# Patient Record
Sex: Female | Born: 1955 | Race: White | Hispanic: No | Smoking: Former smoker
Health system: Southern US, Community
[De-identification: ages and names within clinical notes are randomized; demographics above are authoritative.]

## PROBLEM LIST (undated history)

## (undated) DIAGNOSIS — M1990 Unspecified osteoarthritis, unspecified site: Secondary | ICD-10-CM

## (undated) DIAGNOSIS — F329 Major depressive disorder, single episode, unspecified: Secondary | ICD-10-CM

## (undated) DIAGNOSIS — T7840XA Allergy, unspecified, initial encounter: Secondary | ICD-10-CM

## (undated) DIAGNOSIS — F32A Depression, unspecified: Secondary | ICD-10-CM

## (undated) DIAGNOSIS — Z853 Personal history of malignant neoplasm of breast: Secondary | ICD-10-CM

## (undated) DIAGNOSIS — Z923 Personal history of irradiation: Secondary | ICD-10-CM

## (undated) DIAGNOSIS — D649 Anemia, unspecified: Secondary | ICD-10-CM

## (undated) DIAGNOSIS — H269 Unspecified cataract: Secondary | ICD-10-CM

## (undated) DIAGNOSIS — C50919 Malignant neoplasm of unspecified site of unspecified female breast: Secondary | ICD-10-CM

## (undated) HISTORY — DX: Depression, unspecified: F32.A

## (undated) HISTORY — DX: Unspecified osteoarthritis, unspecified site: M19.90

## (undated) HISTORY — DX: Personal history of malignant neoplasm of breast: Z85.3

## (undated) HISTORY — DX: Unspecified cataract: H26.9

## (undated) HISTORY — DX: Major depressive disorder, single episode, unspecified: F32.9

## (undated) HISTORY — PX: COLONOSCOPY: SHX174

## (undated) HISTORY — DX: Allergy, unspecified, initial encounter: T78.40XA

## (undated) HISTORY — DX: Anemia, unspecified: D64.9

---

## 2001-02-08 ENCOUNTER — Ambulatory Visit (HOSPITAL_COMMUNITY): Admission: RE | Admit: 2001-02-08 | Discharge: 2001-02-08 | Payer: Self-pay | Admitting: General Surgery

## 2003-04-15 HISTORY — PX: OTHER SURGICAL HISTORY: SHX169

## 2003-06-06 ENCOUNTER — Ambulatory Visit (HOSPITAL_COMMUNITY): Admission: RE | Admit: 2003-06-06 | Discharge: 2003-06-06 | Payer: Self-pay | Admitting: Family Medicine

## 2004-03-25 ENCOUNTER — Other Ambulatory Visit: Admission: RE | Admit: 2004-03-25 | Discharge: 2004-03-25 | Payer: Self-pay | Admitting: Dermatology

## 2004-04-14 HISTORY — PX: COLON SURGERY: SHX602

## 2004-06-07 ENCOUNTER — Ambulatory Visit (HOSPITAL_COMMUNITY): Admission: RE | Admit: 2004-06-07 | Discharge: 2004-06-07 | Payer: Self-pay | Admitting: Family Medicine

## 2004-06-26 ENCOUNTER — Ambulatory Visit (HOSPITAL_COMMUNITY): Admission: RE | Admit: 2004-06-26 | Discharge: 2004-06-26 | Payer: Self-pay | Admitting: Family Medicine

## 2004-09-06 ENCOUNTER — Ambulatory Visit (HOSPITAL_BASED_OUTPATIENT_CLINIC_OR_DEPARTMENT_OTHER): Admission: RE | Admit: 2004-09-06 | Discharge: 2004-09-06 | Payer: Self-pay | Admitting: General Surgery

## 2004-09-06 ENCOUNTER — Ambulatory Visit (HOSPITAL_COMMUNITY): Admission: RE | Admit: 2004-09-06 | Discharge: 2004-09-06 | Payer: Self-pay | Admitting: General Surgery

## 2004-11-19 ENCOUNTER — Ambulatory Visit (HOSPITAL_COMMUNITY): Admission: RE | Admit: 2004-11-19 | Discharge: 2004-11-19 | Payer: Self-pay | Admitting: Family Medicine

## 2005-01-14 ENCOUNTER — Other Ambulatory Visit: Admission: RE | Admit: 2005-01-14 | Discharge: 2005-01-14 | Payer: Self-pay | Admitting: Obstetrics and Gynecology

## 2005-01-30 ENCOUNTER — Ambulatory Visit (HOSPITAL_COMMUNITY): Admission: RE | Admit: 2005-01-30 | Discharge: 2005-01-30 | Payer: Self-pay | Admitting: Obstetrics and Gynecology

## 2005-03-01 ENCOUNTER — Emergency Department: Payer: Self-pay | Admitting: General Practice

## 2005-03-04 ENCOUNTER — Inpatient Hospital Stay: Payer: Self-pay | Admitting: Surgery

## 2005-04-14 HISTORY — PX: ABDOMINAL HYSTERECTOMY: SHX81

## 2005-04-18 ENCOUNTER — Ambulatory Visit: Payer: Self-pay | Admitting: Unknown Physician Specialty

## 2005-11-20 ENCOUNTER — Inpatient Hospital Stay: Payer: Self-pay | Admitting: Unknown Physician Specialty

## 2008-04-14 HISTORY — PX: HEEL SPUR SURGERY: SHX665

## 2011-04-15 DIAGNOSIS — C50919 Malignant neoplasm of unspecified site of unspecified female breast: Secondary | ICD-10-CM

## 2011-04-15 HISTORY — PX: BREAST SURGERY: SHX581

## 2011-04-15 HISTORY — PX: BREAST LUMPECTOMY: SHX2

## 2011-04-15 HISTORY — DX: Malignant neoplasm of unspecified site of unspecified female breast: C50.919

## 2011-07-31 ENCOUNTER — Ambulatory Visit: Payer: Self-pay | Admitting: Internal Medicine

## 2011-08-11 ENCOUNTER — Ambulatory Visit: Payer: Self-pay | Admitting: Internal Medicine

## 2011-08-26 ENCOUNTER — Ambulatory Visit: Payer: Self-pay | Admitting: General Surgery

## 2011-09-23 ENCOUNTER — Ambulatory Visit: Payer: Self-pay | Admitting: General Surgery

## 2011-09-24 LAB — PATHOLOGY REPORT

## 2011-09-26 ENCOUNTER — Ambulatory Visit: Payer: Self-pay | Admitting: Oncology

## 2011-09-29 ENCOUNTER — Ambulatory Visit: Payer: Self-pay | Admitting: Oncology

## 2011-10-13 ENCOUNTER — Ambulatory Visit: Payer: Self-pay | Admitting: Oncology

## 2011-10-13 HISTORY — PX: BREAST EXCISIONAL BIOPSY: SUR124

## 2012-01-15 ENCOUNTER — Ambulatory Visit: Payer: Self-pay | Admitting: Oncology

## 2012-02-05 LAB — CBC CANCER CENTER
Basophil #: 0 x10 3/mm (ref 0.0–0.1)
Basophil %: 0.5 %
Eosinophil %: 3.9 %
HCT: 41.5 % (ref 35.0–47.0)
Lymphocyte %: 26.7 %
MCHC: 32.5 g/dL (ref 32.0–36.0)
MCV: 94 fL (ref 80–100)
Monocyte #: 0.6 x10 3/mm (ref 0.2–0.9)
RDW: 13.2 % (ref 11.5–14.5)

## 2012-02-12 LAB — CBC CANCER CENTER
Basophil #: 0 x10 3/mm (ref 0.0–0.1)
Basophil %: 0.5 %
Eosinophil #: 0.3 x10 3/mm (ref 0.0–0.7)
HCT: 42.1 % (ref 35.0–47.0)
HGB: 13.9 g/dL (ref 12.0–16.0)
Lymphocyte #: 1.6 x10 3/mm (ref 1.0–3.6)
MCH: 30.7 pg (ref 26.0–34.0)
MCHC: 32.9 g/dL (ref 32.0–36.0)
MCV: 93 fL (ref 80–100)
Neutrophil #: 4.5 x10 3/mm (ref 1.4–6.5)
Neutrophil %: 62 %
RBC: 4.52 10*6/uL (ref 3.80–5.20)

## 2012-02-13 ENCOUNTER — Ambulatory Visit: Payer: Self-pay | Admitting: Oncology

## 2012-02-19 LAB — CBC CANCER CENTER
Basophil %: 0.5 %
Eosinophil %: 5 %
HCT: 40.6 % (ref 35.0–47.0)
Lymphocyte %: 18.9 %
Monocyte %: 12 %
Neutrophil #: 3.9 x10 3/mm (ref 1.4–6.5)
Neutrophil %: 63.6 %
WBC: 6.2 x10 3/mm (ref 3.6–11.0)

## 2012-03-14 ENCOUNTER — Ambulatory Visit: Payer: Self-pay | Admitting: Oncology

## 2012-03-18 LAB — CBC CANCER CENTER
Basophil #: 0 x10 3/mm (ref 0.0–0.1)
Basophil %: 0.6 %
Eosinophil %: 0 %
HGB: 13.6 g/dL (ref 12.0–16.0)
Lymphocyte %: 17 %
Monocyte %: 14.9 %
Neutrophil %: 67.5 %
Platelet: 129 x10 3/mm — ABNORMAL LOW (ref 150–440)
RBC: 4.36 10*6/uL (ref 3.80–5.20)
WBC: 5.3 x10 3/mm (ref 3.6–11.0)

## 2012-04-01 LAB — CBC CANCER CENTER
Basophil #: 0 x10 3/mm (ref 0.0–0.1)
Basophil %: 0.4 %
Eosinophil #: 0.1 x10 3/mm (ref 0.0–0.7)
HGB: 13.1 g/dL (ref 12.0–16.0)
Lymphocyte %: 16.5 %
MCHC: 33.8 g/dL (ref 32.0–36.0)
Neutrophil %: 71 %
Platelet: 144 x10 3/mm — ABNORMAL LOW (ref 150–440)
RBC: 4.23 10*6/uL (ref 3.80–5.20)

## 2012-04-14 ENCOUNTER — Ambulatory Visit: Payer: Self-pay | Admitting: Oncology

## 2012-07-22 ENCOUNTER — Ambulatory Visit: Payer: Self-pay | Admitting: Oncology

## 2012-08-12 ENCOUNTER — Ambulatory Visit: Payer: Self-pay | Admitting: Oncology

## 2012-08-16 ENCOUNTER — Encounter: Payer: Self-pay | Admitting: *Deleted

## 2012-09-21 ENCOUNTER — Ambulatory Visit: Payer: Self-pay | Admitting: General Surgery

## 2013-01-25 ENCOUNTER — Ambulatory Visit: Payer: Self-pay | Admitting: Radiation Oncology

## 2013-04-23 ENCOUNTER — Ambulatory Visit: Payer: Self-pay | Admitting: Otolaryngology

## 2013-04-23 LAB — BASIC METABOLIC PANEL
Anion Gap: 8 (ref 7–16)
BUN: 17 mg/dL (ref 7–18)
CALCIUM: 9 mg/dL (ref 8.5–10.1)
CO2: 25 mmol/L (ref 21–32)
Chloride: 104 mmol/L (ref 98–107)
Creatinine: 1.03 mg/dL (ref 0.60–1.30)
GLUCOSE: 121 mg/dL — AB (ref 65–99)
OSMOLALITY: 277 (ref 275–301)
POTASSIUM: 3.7 mmol/L (ref 3.5–5.1)
SODIUM: 137 mmol/L (ref 136–145)

## 2013-04-23 LAB — CBC
HCT: 45.1 % (ref 35.0–47.0)
HGB: 15.3 g/dL (ref 12.0–16.0)
MCH: 30.3 pg (ref 26.0–34.0)
MCHC: 33.9 g/dL (ref 32.0–36.0)
MCV: 90 fL (ref 80–100)
PLATELETS: 168 10*3/uL (ref 150–440)
RBC: 5.04 10*6/uL (ref 3.80–5.20)
RDW: 13.4 % (ref 11.5–14.5)
WBC: 5.4 10*3/uL (ref 3.6–11.0)

## 2014-02-13 ENCOUNTER — Encounter: Payer: Self-pay | Admitting: *Deleted

## 2014-08-05 NOTE — Op Note (Signed)
PATIENT NAME:  Candice Nelson, Candice Nelson MR#:  [id number] DATE OF BIRTH:  1955-11-17  DATE OF PROCEDURE:  04/23/2013  DATE OF DICTATION: 04/23/2013   SURGEON: Janalee Dane, MD  PREOPERATIVE DIAGNOSIS: Airway foreign body.   POSTOPERATIVE DIAGNOSIS: Airway foreign body.   PROCEDURE: Rigid bronchoscopy with foreign body removal.   DESCRIPTION OF THE PROCEDURE: The patient was placed in the supine position on the operating room table. After light general anesthesia had been induced, keeping the patient breathing spontaneously, the patient was turned 90 degrees counterclockwise from anesthesia, and the larynx was examined with the anesthesia laryngoscope (McIntosh blade). The vocal cords were sprayed with 4% plain lidocaine. Spontaneous ventilation continued, and the small adult rigid bronchoscope was placed. The trachea was examined, and during this entire rigid bronchoscopy, ventilation was used through the ventilation port of the bronchoscope. The carina was clear. The right mainstem bronchus and secondary bronchi were clear. However, in the left posterior segmental takeoff, there was significant frothy particulate matter consistent with a dissolved pill. This was suctioned completely multiple times and then irrigated with 5 mL of saline and suctioned additionally. There were no discrete large segments of foreign body, but after this had been satisfactorily suctioned, the bronchoscope was removed. The patient was returned to anesthesia, emerged uneventfully and was taken to the recovery room in stable condition. There were no complications.   ESTIMATED BLOOD LOSS: Zero.   ____________________________ Lenna Sciara. Nadeen Landau, MD jmc:lb D: 04/23/2013 11:29:08 ET T: 04/23/2013 11:53:46 ET JOB#: [id number]  cc: Janalee Dane, MD, <Dictator> Nicholos Johns MD ELECTRONICALLY SIGNED 05/03/2013 11:41

## 2014-08-05 NOTE — Consult Note (Signed)
PATIENT NAME:  Candice Nelson, Candice Nelson MR#:  [id number] DATE OF BIRTH:  05-13-1955  DATE OF CONSULTATION:  04/23/2013  CONSULTING PHYSICIAN:  Janalee Dane, MD  REQUESTING PHYSICIAN: Dr. Lisa Roca.   HISTORY OF PRESENT ILLNESS: The patient is a 59 year old white female who was taking her morning vitamins at about 7:30 this morning and began coughing and accidentally inhaled one of the medications. She came to the Emergency Room in moderate respiratory distress. She was evaluated by Dr. Reita Cliche and I was called for emergent consultation. A chest x-ray was performed that did not show any discrete foreign body and a decision was made to take the patient to the operating room for emergent rigid bronchoscopy with foreign body removal.   ALLERGIES: TYLOX, PERCODAN, PERCOCET, VALIUM, PENICILLIN.   MEDICATIONS: Reviewed and documented in the chart.   PAST MEDICAL HISTORY: Likewise reviewed and documented in the chart. Notably the patient did have right axillary nodal dissection for breast cancer as well as right breast surgery.   PHYSICAL EXAMINATION: GENERAL: The patient that is in no acute airway distress but she is coughing fairly paroxysmally.   ORAL CAVITY AND OROPHARYNX: Clear. No masses or lesions.  EARS: External auditory canals are clear. Tympanic membranes are clear.  NOSE: Nares are patent. Septum deviated slightly to the right.  NECK: Trachea is midline.  LUNGS: Diffuse wheeze slightly more prominent on the left.   IMPRESSION: Probable airway foreign body.   PLAN:  We will take the patient to the operating room for rigid bronchoscopy and airway foreign body removal emergently.      ____________________________ J. Nadeen Landau, MD jmc:dp D: 04/23/2013 11:33:04 ET T: 04/23/2013 11:41:57 ET JOB#: [id number]  cc: Janalee Dane, MD, <Dictator> [place] MD ELECTRONICALLY SIGNED 05/03/2013 11:41

## 2014-08-06 NOTE — Op Note (Signed)
PATIENT NAME:  Candice Nelson, APPLEBY MR#:  [id number] DATE OF BIRTH:  May 08, 1955  DATE OF PROCEDURE:  09/23/2011  PREOPERATIVE DIAGNOSIS: Right breast cancer.   POSTOPERATIVE DIAGNOSIS: Right breast cancer.   OPERATIVE PROCEDURES: Right breast wide excision with mastoplasty, sentinel node biopsy.   SURGEON: Robert Bellow, MD  ANESTHESIA: General by LMA under Dr. Kayleen Memos, Marcaine 0.5% plain, 30 mL local infiltration.   CLINICAL NOTE: This 59 year old woman was recently diagnosed with a right breast cancer in the 6:00 position. She desired breast conservation. She is brought to the Operating Room having previously been injected with technetium sulfur colloid.   OPERATIVE NOTE: With the patient under adequate general anesthesia, a small roll behind the right shoulder, the breast, chest, and axilla was prepped with ChloraPrep and draped. Prior to prepping 2 mL of methylene blue diluted 1:2 with normal saline was injected at the superolateral aspect of the areola. Counts obtained in the axilla with the gamma finder were low at approximately 30. After prepping and draping attention was turned to the axilla. The gamma finder was used to identify the area of maximum uptake and a small a transverse incision made at this location. This was carried down through the skin and subcutaneous tissue with hemostasis achieved by electrocautery and 3-0 Vicryl ties. A single hot blue node measuring about 8-9 mm in diameter was identified. Touch preps were reported as negative for metastatic disease by Quay Burow, M.D. The wound was closed in layers with 2-0 Vicryl figure-of-eight sutures in the deep adipose tissue and a running 4-0 Vicryl subcuticular suture for the skin. Benzoin, Steri-Strips, Telfa, and Tegaderm dressing was applied.   Attention was turned to the breast. Ultrasound was used to confirm the area of the previous biopsy. There had been a suggestion of a small duct extending from this area towards the  nipple. For this reason it was elected to make an elliptical incision at the 6:00 position including a portion of the areola extending to the base of the nipple. The borders were guided by the ultrasound. The skin was incised sharply and hemostasis achieved by electrocautery. The tissue just and under the nipple was excised and then a block of tissue extending down to but not including the deep adipose tissue. There was about 5 mm residual breast parenchyma at the bottom of the wide excision site. The specimen was orientated and report from the pathologist suggested negative margins. The breast was elevated off the underlying pectoralis fascia for a distance of approximately 6 cm. This was then approximated with interrupted 2-0 Vicryl figure-of-eight sutures. The adipose tissue was approximated in a similar fashion with an effort made to maintain the volume behind the areola. The skin on the lateral aspect of the incision was freed for a distance of about 4 cm to provide for a more smooth contour. The skin was approximated with a running 4-0 Vicryl subcuticular suture except in the area of the areola where interrupted subcuticular sutures were utilized. With the sponge, tape and instrument count correct, final dressing was applied with benzoin, Steri-Strips and Telfa. A compressive wrap with fluffed gauze, Kerlix and an Ace wrap was then applied.   Patient tolerated procedure well and was taken to recovery room in stable condition.   ____________________________ Robert Bellow, MD jwb:cms D: 09/23/2011 14:05:40 ET T: 09/23/2011 14:19:43 ET JOB#: [id number]  cc: Robert Bellow, MD, <Dictator> Ocie Cornfield. Ouida Sills, MD JEFFREY Amedeo Kinsman MD ELECTRONICALLY SIGNED 09/23/2011 22:19

## 2015-09-24 ENCOUNTER — Other Ambulatory Visit: Payer: Self-pay | Admitting: Internal Medicine

## 2015-09-24 DIAGNOSIS — Z1231 Encounter for screening mammogram for malignant neoplasm of breast: Secondary | ICD-10-CM

## 2015-09-25 ENCOUNTER — Other Ambulatory Visit: Payer: Self-pay | Admitting: Internal Medicine

## 2015-09-25 DIAGNOSIS — R928 Other abnormal and inconclusive findings on diagnostic imaging of breast: Secondary | ICD-10-CM

## 2015-10-11 ENCOUNTER — Ambulatory Visit
Admission: RE | Admit: 2015-10-11 | Discharge: 2015-10-11 | Disposition: A | Discharge status: Home / Self Care | Payer: Managed Care, Other (non HMO) | Source: Ambulatory Visit | Attending: Internal Medicine | Admitting: Internal Medicine

## 2015-10-11 ENCOUNTER — Ambulatory Visit: Payer: Managed Care, Other (non HMO)

## 2015-10-11 DIAGNOSIS — R928 Other abnormal and inconclusive findings on diagnostic imaging of breast: Secondary | ICD-10-CM

## 2016-11-14 ENCOUNTER — Other Ambulatory Visit: Payer: Self-pay | Admitting: Internal Medicine

## 2016-11-14 DIAGNOSIS — Z9889 Other specified postprocedural states: Secondary | ICD-10-CM

## 2016-11-14 DIAGNOSIS — R928 Other abnormal and inconclusive findings on diagnostic imaging of breast: Secondary | ICD-10-CM

## 2016-12-17 ENCOUNTER — Ambulatory Visit
Admission: RE | Admit: 2016-12-17 | Discharge: 2016-12-17 | Disposition: A | Discharge status: Home / Self Care | Payer: Managed Care, Other (non HMO) | Source: Ambulatory Visit | Attending: Internal Medicine | Admitting: Internal Medicine

## 2016-12-17 DIAGNOSIS — Z9889 Other specified postprocedural states: Secondary | ICD-10-CM

## 2016-12-17 DIAGNOSIS — R928 Other abnormal and inconclusive findings on diagnostic imaging of breast: Secondary | ICD-10-CM

## 2016-12-17 HISTORY — DX: Personal history of irradiation: Z92.3

## 2017-09-09 ENCOUNTER — Other Ambulatory Visit: Payer: Self-pay | Admitting: Internal Medicine

## 2017-10-23 ENCOUNTER — Telehealth: Payer: Self-pay | Admitting: Podiatry

## 2017-10-23 NOTE — Telephone Encounter (Signed)
I had surgery with Dr. Milinda Pointer on my ankle in January 2010. I need my records for that surgery to be faxed over to Dr. Roland Rack. Their fax number is [phone].

## 2017-10-23 NOTE — Telephone Encounter (Signed)
Called and left a voicemail letting pt know I received her voicemail and fax but I need her to sign a medical records release form. I told her I could either fax it to her or e-mail it to her or she could come by the office and fill the form out and sign it in the [place] office. I told her she could call me back directly at [phone] and to leave a message if I do not answer.

## 2017-12-23 ENCOUNTER — Ambulatory Visit: Payer: Managed Care, Other (non HMO)

## 2017-12-23 ENCOUNTER — Ambulatory Visit
Admission: RE | Admit: 2017-12-23 | Discharge: 2017-12-23 | Disposition: A | Discharge status: Home / Self Care | Payer: Managed Care, Other (non HMO) | Source: Ambulatory Visit | Attending: Oncology | Admitting: Oncology

## 2017-12-23 VITALS — BP 161/75 | HR 62 | Temp 98.1°F | Ht 65.0 in | Wt 134.0 lb

## 2017-12-23 DIAGNOSIS — Z853 Personal history of malignant neoplasm of breast: Secondary | ICD-10-CM | POA: Insufficient documentation

## 2017-12-23 DIAGNOSIS — Z0000 Encounter for general adult medical examination without abnormal findings: Secondary | ICD-10-CM

## 2017-12-23 HISTORY — DX: Malignant neoplasm of unspecified site of unspecified female breast: C50.919

## 2017-12-23 NOTE — Progress Notes (Signed)
  Subjective:     Patient ID: Candice Nelson, female   DOB: 02/03/1956, 62 y.o.   MRN: [id number]  HPI   Review of Systems     Objective:   Physical Exam  Pulmonary/Chest: Right breast exhibits no inverted nipple, no mass, no nipple discharge, no skin change and no tenderness. Left breast exhibits no inverted nipple, no mass, no nipple discharge, no skin change and no tenderness. Breasts are symmetrical.  History of right lumpectomy with sentinel node biopsy for breast cancer 2013         Assessment:     62 year old with history of right breast cancer presents for [facility] clinic visit.   Instructed patient on breast self awareness using teach back method.  Clinical breast exam unremarkable. No mass or lump palpated.    Plan:     Per Lorriane Shire in [facility], patient sent for bilateral screening mammogram.

## 2017-12-25 NOTE — Progress Notes (Signed)
Letter mailed from [facility] to notify of normal mammogram results.  Patient to return in one year for annual screening.  Copy to [facility]. 

## 2018-09-09 ENCOUNTER — Encounter: Payer: Self-pay | Admitting: *Deleted

## 2018-11-18 ENCOUNTER — Ambulatory Visit (INDEPENDENT_AMBULATORY_CARE_PROVIDER_SITE_OTHER): Payer: Self-pay | Admitting: *Deleted

## 2018-11-18 ENCOUNTER — Other Ambulatory Visit: Payer: Self-pay

## 2018-11-18 DIAGNOSIS — Z1211 Encounter for screening for malignant neoplasm of colon: Secondary | ICD-10-CM

## 2018-11-18 MED ORDER — NA SULFATE-K SULFATE-MG SULF 17.5-3.13-1.6 GM/177ML PO SOLN: 1.0000 | Freq: Once | ORAL | 0 refills | Status: AC

## 2018-11-18 NOTE — Progress Notes (Signed)
Gastroenterology Pre-Procedure Review  Request Date: 11/18/2018 Requesting Physician: Delman Cheadle, PA @ [facility], Last TCS 17 years ago done at [facility] per pt,could not remember physician  PATIENT REVIEW QUESTIONS: The patient responded to the following health history questions as indicated:    1. Diabetes Melitis: No 2. Joint replacements in the past 12 months: No 3. Major health problems in the past 3 months: No 4. Has an artificial valve or MVP: No 5. Has a defibrillator: No 6. Has been advised in past to take antibiotics in advance of a procedure like teeth cleaning: No 7. Family history of colon cancer: No 8. Alcohol Use: No 9. History of sleep apnea: No 10. History of coronary artery or other vascular stents placed within the last 12 months: No 11. History of any prior anesthesia complications: Yes, makes her very sick, Woke up during procedure vomiting at age 63.    MEDICATIONS & ALLERGIES:    Patient reports the following regarding taking any blood thinners:   Plavix? No Aspirin? Yes, 2000 mg daily Coumadin? No Brilinta? No Xarelto? No Eliquis? No Pradaxa? No Savaysa? No Effient? No  Patient confirms/reports the following medications:  Current Outpatient Medications  Medication Sig Dispense Refill  . aspirin 500 MG EC tablet Take 500 mg by mouth every 6 (six) hours as needed for pain. Takes 4 tablets/ 2000 mg daily    . buPROPion (WELLBUTRIN XL) 300 MG 24 hr tablet Take 300 mg by mouth daily.     No current facility-administered medications for this visit.     Patient confirms/reports the following allergies:  Allergies  Allergen Reactions  . Demerol [Meperidine] Nausea Only  . Morphine And Related Hives  . Percocet [Oxycodone-Acetaminophen] Nausea Only  . Shellfish Allergy Nausea Only  . Valium [Diazepam] Nausea Only  . Penicillins Rash  . Sulfa Antibiotics Rash    No orders of the defined types were placed in this encounter.   AUTHORIZATION  INFORMATION Primary Insurance: [place] Falling,  [place] #: [id number],  Group #: [id number] Pre-Cert / Josem Kaufmann required: Not required  SCHEDULE INFORMATION: Procedure has been scheduled as follows:  Date: 12/13/2018, Time: 12:00  Location: [facility] with Dr. Oneida Alar  This Gastroenterology Pre-Precedure Review Form is being routed to the following provider(s): Neil Crouch, PA-C

## 2018-11-18 NOTE — Patient Instructions (Signed)
Candice Nelson  1955-10-10 MRN: [id number]     Procedure Date: 12/13/2018 Time to register: 11:00 am Place to register: [facility] Procedure Time: 12:00 pm Scheduled provider: Dr. Oneida Alar    PREPARATION FOR COLONOSCOPY WITH SUPREP BOWEL PREP KIT  Note: Suprep Bowel Prep Kit is a split-dose (2day) regimen. Consumption of BOTH 6-ounce bottles is required for a complete prep.  Please notify us immediately if you are diabetic, take iron supplements, or if you are on Coumadin or any other blood thinners.                                                                                                                                                   1 DAY BEFORE PROCEDURE:  DATE: 12/12/2018 DAY: Sunday Continue clear liquids the entire day - NO SOLID FOOD.     At 6:00pm: Complete steps 1 through 4 below, using ONE (1) 6-ounce bottle, before going to bed. Step 1:  Pour ONE (1) 6-ounce bottle of SUPREP liquid into the mixing container.  Step 2:  Add cool drinking water to the 16 ounce line on the container and mix.  Note: Dilute the solution concentrate as directed prior to use. Step 3:  DRINK ALL the liquid in the container. Step 4:  You MUST drink an additional two (2) or more 16 ounce containers of water over the next one (1) hour.   Continue clear liquids.  DAY OF PROCEDURE:   DATE: 12/13/2018  DAY: Monday If you take medications for your heart, blood pressure, or breathing, you may take these medications.    5 hours before your procedure at :  7:00 am Step 1:  Pour ONE (1) 6-ounce bottle of SUPREP liquid into the mixing container.  Step 2:  Add cool drinking water to the 16 ounce line on the container and mix.  Note: Dilute the solution concentrate as directed prior to use. Step 3:  DRINK ALL the liquid in the container. Step 4:  You MUST drink an additional two (2) or more 16 ounce containers of water over the next one (1) hour. You MUST complete the final glass of  water at least 3 hours before your colonoscopy. Nothing by mouth past 9:00 am  You may take your morning medications with sip of water unless we have instructed otherwise.    Please see below for Dietary Information.  CLEAR LIQUIDS INCLUDE:  Water Jello (NOT red in color)   Ice Popsicles (NOT red in color)   Tea (sugar ok, no milk/cream) Powdered fruit flavored drinks  Coffee (sugar ok, no milk/cream) Gatorade/ Lemonade/ Kool-Aid  (NOT red in color)   Juice: apple, white grape, white cranberry Soft drinks  Clear bullion, consomme, broth (fat free beef/chicken/vegetable)  Carbonated beverages (any kind)  Strained chicken noodle soup Hard Candy   Remember: Clear  liquids are liquids that will allow you to see your fingers on the other side of a clear glass. Be sure liquids are NOT red in color, and not cloudy, but CLEAR.  DO NOT EAT OR DRINK ANY OF THE FOLLOWING:  Dairy products of any kind   Cranberry juice Tomato juice / V8 juice   Grapefruit juice Orange juice     Red grape juice  Do not eat any solid foods, including such foods as: cereal, oatmeal, yogurt, fruits, vegetables, creamed soups, eggs, bread, crackers, pureed foods in a blender, etc.   HELPFUL HINTS FOR DRINKING PREP SOLUTION:   Make sure prep is extremely cold. Mix and refrigerate the the morning of the prep. You may also put in the freezer.   You may try mixing some Crystal Light or Country Time Lemonade if you prefer. Mix in small amounts; add more if necessary.  Try drinking through a straw  Rinse mouth with water or a mouthwash between glasses, to remove after-taste.  Try sipping on a cold beverage /ice/ popsicles between glasses of prep.  Place a piece of sugar-free hard candy in mouth between glasses.  If you become nauseated, try consuming smaller amounts, or stretch out the time between glasses. Stop for 30-60 minutes, then slowly start back drinking.     OTHER INSTRUCTIONS  You will need a  responsible adult at least 62 years of age to accompany you and drive you home. This person must remain in the waiting room during your procedure. The hospital will cancel your procedure if you do not have a responsible adult with you.   1. Wear loose fitting clothing that is easily removed. 2. Leave jewelry and other valuables at home.  3. Remove all body piercing jewelry and leave at home. 4. Total time from sign-in until discharge is approximately 2-3 hours. 5. You should go home directly after your procedure and rest. You can resume normal activities the day after your procedure. 6. The day of your procedure you should not:  Drive  Make legal decisions  Operate machinery  Drink alcohol  Return to work   You may call the office (Dept: [phone]) before 5:00pm, or page the doctor on call [phone]) after 5:00pm, for further instructions, if necessary.   Insurance Information YOU WILL NEED TO CHECK WITH YOUR INSURANCE COMPANY FOR THE BENEFITS OF COVERAGE YOU HAVE FOR THIS PROCEDURE.  UNFORTUNATELY, NOT ALL INSURANCE COMPANIES HAVE BENEFITS TO COVER ALL OR PART OF THESE TYPES OF PROCEDURES.  IT IS YOUR RESPONSIBILITY TO CHECK YOUR BENEFITS, HOWEVER, WE WILL BE GLAD TO ASSIST YOU WITH ANY CODES YOUR INSURANCE COMPANY MAY NEED.    PLEASE NOTE THAT MOST INSURANCE COMPANIES WILL NOT COVER A SCREENING COLONOSCOPY FOR PEOPLE UNDER THE AGE OF 50  IF YOU HAVE BCBS INSURANCE, YOU MAY HAVE BENEFITS FOR A SCREENING COLONOSCOPY BUT IF POLYPS ARE FOUND THE DIAGNOSIS WILL CHANGE AND THEN YOU MAY HAVE A DEDUCTIBLE THAT WILL NEED TO BE MET. SO PLEASE MAKE SURE YOU CHECK YOUR BENEFITS FOR A SCREENING COLONOSCOPY AS WELL AS A DIAGNOSTIC COLONOSCOPY.

## 2018-11-19 NOTE — Progress Notes (Signed)
Ok to schedule.   Please tell patient that she should take no more than ASA 325mg  per day the week prior to her colonoscopy. May be limited after colonoscopy based on if polyps taken out due to risk of bleeding.   Please give Phenergan 12.5mg  IV 30 minutes before procedure to help prevent n/v related to anesthesia.

## 2018-11-22 ENCOUNTER — Encounter: Payer: Self-pay | Admitting: *Deleted

## 2018-11-22 NOTE — Progress Notes (Signed)
Called pt and informed her of recommendations concerning ASA.  Pt aware that I will also send out a reminder letter of these recommendations.  Pt also informed that she will receive Phenergan prior to her procedure to help with n/v.

## 2018-11-22 NOTE — Addendum Note (Signed)
Addended by: Metro Kung on: 11/22/2018 10:40 AM   Modules accepted: Orders, SmartSet

## 2018-12-10 ENCOUNTER — Other Ambulatory Visit (HOSPITAL_COMMUNITY)
Admission: RE | Admit: 2018-12-10 | Discharge: 2018-12-10 | Disposition: A | Discharge status: Home / Self Care | Payer: 59 | Source: Ambulatory Visit | Attending: Gastroenterology | Admitting: Gastroenterology

## 2018-12-10 ENCOUNTER — Other Ambulatory Visit: Payer: Self-pay

## 2018-12-10 DIAGNOSIS — Z20828 Contact with and (suspected) exposure to other viral communicable diseases: Secondary | ICD-10-CM | POA: Insufficient documentation

## 2018-12-10 DIAGNOSIS — Z01812 Encounter for preprocedural laboratory examination: Secondary | ICD-10-CM | POA: Insufficient documentation

## 2018-12-10 LAB — SARS CORONAVIRUS 2 (TAT 6-24 HRS): SARS Coronavirus 2: NEGATIVE

## 2018-12-13 ENCOUNTER — Ambulatory Visit (HOSPITAL_COMMUNITY)
Admission: RE | Admit: 2018-12-13 | Discharge: 2018-12-13 | Disposition: A | Discharge status: Home / Self Care | Payer: No Typology Code available for payment source | Attending: Gastroenterology | Admitting: Gastroenterology

## 2018-12-13 ENCOUNTER — Encounter (HOSPITAL_COMMUNITY)
Admission: RE | Disposition: A | Discharge status: Home / Self Care | Payer: Self-pay | Source: Home / Self Care | Attending: Gastroenterology

## 2018-12-13 ENCOUNTER — Encounter (HOSPITAL_COMMUNITY): Payer: Self-pay | Admitting: *Deleted

## 2018-12-13 DIAGNOSIS — K621 Rectal polyp: Secondary | ICD-10-CM | POA: Diagnosis not present

## 2018-12-13 DIAGNOSIS — D122 Benign neoplasm of ascending colon: Secondary | ICD-10-CM | POA: Insufficient documentation

## 2018-12-13 DIAGNOSIS — Z853 Personal history of malignant neoplasm of breast: Secondary | ICD-10-CM | POA: Diagnosis not present

## 2018-12-13 DIAGNOSIS — Q438 Other specified congenital malformations of intestine: Secondary | ICD-10-CM | POA: Insufficient documentation

## 2018-12-13 DIAGNOSIS — K5730 Diverticulosis of large intestine without perforation or abscess without bleeding: Secondary | ICD-10-CM | POA: Insufficient documentation

## 2018-12-13 DIAGNOSIS — F329 Major depressive disorder, single episode, unspecified: Secondary | ICD-10-CM | POA: Diagnosis not present

## 2018-12-13 DIAGNOSIS — K635 Polyp of colon: Secondary | ICD-10-CM

## 2018-12-13 DIAGNOSIS — D123 Benign neoplasm of transverse colon: Secondary | ICD-10-CM | POA: Diagnosis not present

## 2018-12-13 DIAGNOSIS — Z1211 Encounter for screening for malignant neoplasm of colon: Secondary | ICD-10-CM | POA: Diagnosis not present

## 2018-12-13 DIAGNOSIS — Z923 Personal history of irradiation: Secondary | ICD-10-CM | POA: Diagnosis not present

## 2018-12-13 DIAGNOSIS — Z87891 Personal history of nicotine dependence: Secondary | ICD-10-CM | POA: Diagnosis not present

## 2018-12-13 DIAGNOSIS — Z79899 Other long term (current) drug therapy: Secondary | ICD-10-CM | POA: Diagnosis not present

## 2018-12-13 DIAGNOSIS — K648 Other hemorrhoids: Secondary | ICD-10-CM | POA: Diagnosis not present

## 2018-12-13 DIAGNOSIS — K644 Residual hemorrhoidal skin tags: Secondary | ICD-10-CM | POA: Diagnosis not present

## 2018-12-13 HISTORY — PX: POLYPECTOMY: SHX5525

## 2018-12-13 HISTORY — PX: COLONOSCOPY: SHX5424

## 2018-12-13 SURGERY — COLONOSCOPY
Anesthesia: Moderate Sedation

## 2018-12-13 MED ORDER — SODIUM CHLORIDE 0.9% FLUSH
INTRAVENOUS | Status: AC
  Filled 2018-12-13: qty 10

## 2018-12-13 MED ORDER — FENTANYL CITRATE (PF) 100 MCG/2ML IJ SOLN
INTRAMUSCULAR | Status: AC
  Filled 2018-12-13: qty 2

## 2018-12-13 MED ORDER — MIDAZOLAM HCL 5 MG/5ML IJ SOLN
INTRAMUSCULAR | Status: AC
  Filled 2018-12-13: qty 10

## 2018-12-13 MED ORDER — FENTANYL CITRATE (PF) 100 MCG/2ML IJ SOLN
INTRAMUSCULAR | Status: DC | PRN
  Administered 2018-12-13: 25 ug via INTRAVENOUS
  Administered 2018-12-13: 50 ug via INTRAVENOUS

## 2018-12-13 MED ORDER — MIDAZOLAM HCL 5 MG/5ML IJ SOLN
INTRAMUSCULAR | Status: DC | PRN
  Administered 2018-12-13: 2 mg via INTRAVENOUS
  Administered 2018-12-13: 1 mg via INTRAVENOUS
  Administered 2018-12-13: 2 mg via INTRAVENOUS

## 2018-12-13 MED ORDER — PROMETHAZINE HCL 25 MG/ML IJ SOLN
INTRAMUSCULAR | Status: DC | PRN
  Administered 2018-12-13: 12.5 mg via INTRAVENOUS

## 2018-12-13 MED ORDER — PROMETHAZINE HCL 25 MG/ML IJ SOLN
12.5000 mg | Freq: Once | INTRAMUSCULAR | Status: AC
  Administered 2018-12-13: 12.5 mg via INTRAVENOUS

## 2018-12-13 MED ORDER — SODIUM CHLORIDE 0.9 % IV SOLN
INTRAVENOUS | Status: DC
  Administered 2018-12-13: 12:00:00 via INTRAVENOUS

## 2018-12-13 MED ORDER — PROMETHAZINE HCL 25 MG/ML IJ SOLN
INTRAMUSCULAR | Status: AC
  Filled 2018-12-13: qty 1

## 2018-12-13 NOTE — Discharge Instructions (Signed)
You have small internal hemorrhoids and diverticulosis IN YOUR LEFT COLON. YOU HAD SIX POLYPS REMOVED. YOU HAVE A FLOPPY LEFT COLON. I HAD TO CHANGE TO A SCOPE THAT WAS MORE FLEXIBLE AND SMALLER THAN A PEDIATRIC SCOPE TO MAKE IT THROUGH THE RECTOSIGMOID(LEFT) COLON.    DRINK WATER TO KEEP YOUR URINE LIGHT YELLOW.  FOLLOW A HIGH FIBER DIET. AVOID ITEMS THAT CAUSE BLOATING. See info below.   USE PREPARATION H FOUR TIMES  A DAY IF NEEDED TO RELIEVE RECTAL PAIN/PRESSURE/BLEEDING.   YOUR BIOPSY RESULTS WILL BE BACK IN 5 BUSINESS DAYS.  Next colonoscopy in 3 years.  Colonoscopy Care After Read the instructions outlined below and refer to this sheet in the next week. These discharge instructions provide you with general information on caring for yourself after you leave the hospital. While your treatment has been planned according to the most current medical practices available, unavoidable complications occasionally occur. If you have any problems or questions after discharge, call DR. Nocholas Damaso, [phone].  ACTIVITY  You may resume your regular activity, but move at a slower pace for the next 24 hours.   Take frequent rest periods for the next 24 hours.   Walking will help get rid of the air and reduce the bloated feeling in your belly (abdomen).   No driving for 24 hours (because of the medicine (anesthesia) used during the test).   You may shower.   Do not sign any important legal documents or operate any machinery for 24 hours (because of the anesthesia used during the test).    NUTRITION  Drink plenty of fluids.   You may resume your normal diet as instructed by your doctor.   Begin with a light meal and progress to your normal diet. Heavy or fried foods are harder to digest and may make you feel sick to your stomach (nauseated).   Avoid alcoholic beverages for 24 hours or as instructed.    MEDICATIONS  You may resume your normal medications.   WHAT YOU CAN EXPECT  TODAY  Some feelings of bloating in the abdomen.   Passage of more gas than usual.   Spotting of blood in your stool or on the toilet paper  .  IF YOU HAD POLYPS REMOVED DURING THE COLONOSCOPY:  Eat a soft diet IF YOU HAVE NAUSEA, BLOATING, ABDOMINAL PAIN, OR VOMITING.    FINDING OUT THE RESULTS OF YOUR TEST Not all test results are available during your visit. DR. Oneida Alar WILL CALL YOU WITHIN 14 DAYS OF YOUR PROCEDUE WITH YOUR RESULTS. Do not assume everything is normal if you have not heard from DR. Cornelius Schuitema, CALL HER OFFICE AT [phone].  SEEK IMMEDIATE MEDICAL ATTENTION AND CALL THE OFFICE: [phone] IF:  You have more than a spotting of blood in your stool.   Your belly is swollen (abdominal distention).   You are nauseated or vomiting.   You have a temperature over 101F.   You have abdominal pain or discomfort that is severe or gets worse throughout the day.  High-Fiber Diet A high-fiber diet changes your normal diet to include more whole grains, legumes, fruits, and vegetables. Changes in the diet involve replacing refined carbohydrates with unrefined foods. The calorie level of the diet is essentially unchanged. The Dietary Reference Intake (recommended amount) for adult males is 38 grams per day. For adult females, it is 25 grams per day. Pregnant and lactating women should consume 28 grams of fiber per day. Fiber is the intact part of a plant  that is not broken down during digestion. Functional fiber is fiber that has been isolated from the plant to provide a beneficial effect in the body.  PURPOSE  Increase stool bulk.   Ease and regulate bowel movements.   Lower cholesterol.   REDUCE RISK OF COLON CANCER  INDICATIONS THAT YOU NEED MORE FIBER  Constipation and hemorrhoids.   Uncomplicated diverticulosis (intestine condition) and irritable bowel syndrome.   Weight management.   As a protective measure against hardening of the arteries (atherosclerosis),  diabetes, and cancer.   GUIDELINES FOR INCREASING FIBER IN THE DIET  Start adding fiber to the diet slowly. A gradual increase of about 5 more grams (2 servings of most fruits or vegetables) per day is best. Too rapid an increase in fiber may result in constipation, flatulence, and bloating.   Drink enough water and fluids to keep your urine clear or pale yellow. Water, juice, or caffeine-free drinks are recommended. Not drinking enough fluid may cause constipation.   Eat a variety of high-fiber foods rather than one type of fiber.   Try to increase your intake of fiber through using high-fiber foods rather than fiber pills or supplements that contain small amounts of fiber.   The goal is to change the types of food eaten. Do not supplement your present diet with high-fiber foods, but replace foods in your present diet.     Polyps, Colon  A polyp is extra tissue that grows inside your body. Colon polyps grow in the large intestine. The large intestine, also called the colon, is part of your digestive system. It is a long, hollow tube at the end of your digestive tract where your body makes and stores stool. Most polyps are not dangerous. They are benign. This means they are not cancerous. But over time, some types of polyps can turn into cancer. Polyps that are smaller than a pea are usually not harmful. But larger polyps could someday become or may already be cancerous. To be safe, doctors remove all polyps and test them.   PREVENTION There is not one sure way to prevent polyps. You might be able to lower your risk of getting them if you:  Eat more fruits and vegetables and less fatty food.   Do not smoke.   Avoid alcohol.   Exercise every day.   Lose weight if you are overweight.   Eating more calcium and folate can also lower your risk of getting polyps. Some foods that are rich in calcium are milk, cheese, and broccoli. Some foods that are rich in folate are chickpeas, kidney  beans, and spinach.    Diverticulosis Diverticulosis is a common condition that develops when small pouches (diverticula) form in the wall of the colon. The risk of diverticulosis increases with age. It happens more often in people who eat a low-fiber diet. Most individuals with diverticulosis have no symptoms. Those individuals with symptoms usually experience belly (abdominal) pain, constipation, or loose stools (diarrhea).  HOME CARE INSTRUCTIONS  Increase the amount of fiber in your diet as directed by your caregiver or dietician. This may reduce symptoms of diverticulosis.   Drink at least 6 to 8 glasses of water each day to prevent constipation.   Try not to strain when you have a bowel movement.   Avoiding nuts and seeds to prevent complications is NOT NECESSARY.   FOODS HAVING HIGH FIBER CONTENT INCLUDE:  Fruits. Apple, peach, pear, tangerine, raisins, prunes.   Vegetables. Brussels sprouts, asparagus, broccoli, cabbage, carrot,  cauliflower, romaine lettuce, spinach, summer squash, tomato, winter squash, zucchini.   Starchy Vegetables. Baked beans, kidney beans, lima beans, split peas, lentils, potatoes (with skin).    SEEK IMMEDIATE MEDICAL CARE IF:  You develop increasing pain or severe bloating.   You have an oral temperature above 101F.   You develop vomiting or bowel movements that are bloody or black.

## 2018-12-13 NOTE — Progress Notes (Signed)
PT is aware.

## 2018-12-13 NOTE — Op Note (Signed)
[facility] Patient Name: Candice Nelson Procedure Date: 12/13/2018 11:31 AM MRN: [id number] Date of Birth: 09-11-1955 Attending MD: Barney Drain MD, MD CSN: [id number] Age: 63 Admit Type: Outpatient Procedure:                Colonoscopy WITH COLD FORCEPS/SNARE POLYPECTOMY Indications:              Screening for colorectal malignant neoplasm Providers:                Barney Drain MD, MD, Janeece Riggers, RN, Aram Candela Referring MD:             Jake Samples PA Medicines:                Promethazine 25 mg IV, Fentanyl 75 micrograms IV,                            Midazolam 5 mg IV Complications:            No immediate complications. Estimated Blood Loss:     Estimated blood loss was minimal. Procedure:                Pre-Anesthesia Assessment:                           - Prior to the procedure, a History and Physical                            was performed, and patient medications and                            allergies were reviewed. The patient's tolerance of                            previous anesthesia was also reviewed. The risks                            and benefits of the procedure and the sedation                            options and risks were discussed with the patient.                            All questions were answered, and informed consent                            was obtained. Prior Anticoagulants: The patient has                            taken no previous anticoagulant or antiplatelet                            agents. ASA Grade Assessment: II - A patient with                            mild systemic disease. After reviewing the risks  and benefits, the patient was deemed in                            satisfactory condition to undergo the procedure.                            After obtaining informed consent, the colonoscope                            was passed under direct vision. Throughout the                             procedure, the patient's blood pressure, pulse, and                            oxygen saturations were monitored continuously. The                            [id number]) scope was introduced through                            the anus and advanced to the the cecum, identified                            by appendiceal orifice and ileocecal valve. The                            colonoscopy was technically difficult and complex                            due to restricted mobility of the colon and a                            tortuous colon. Successful completion of the                            procedure was aided by increasing the dose of                            sedation medication, changing                            endoscopes(ULTRASLIM), straightening and shortening                            the scope to obtain bowel loop reduction and                            COLOWRAP. The patient tolerated the procedure                            fairly well. The quality of the bowel preparation  was excellent. The ileocecal valve, appendiceal                            orifice, and rectum were photographed. Scope In: 12:01:52 PM Scope Out: 12:31:40 PM Scope Withdrawal Time: 0 hours 21 minutes 26 seconds  Total Procedure Duration: 0 hours 29 minutes 48 seconds  Findings:      Four sessile polyps were found in the hepatic flexure, proximal       ascending colon and cecum(2)-BTL 1. The polyps were 2 to 8 mm in size.       These polyps were removed with a cold snare. Resection and retrieval       were complete.      Two sessile polyps were found in the rectum. The polyps were 2 to 4 mm       in size. These polyps were removed with a cold biopsy forceps. Resection       and retrieval were complete.      Multiple small and large-mouthed diverticula were found in the       recto-sigmoid colon and sigmoid colon.      External and internal hemorrhoids were  found.      The recto-sigmoid colon, sigmoid colon and descending colon were grossly       tortuous. Impression:               - Four 2 to 8 mm polyps at the hepatic flexure, in                            the proximal ascending colon and in the cecum,                            removed with a cold snare. Resected and retrieved.                           - Two 2 to 4 mm polyps in the rectum, removed with                            a cold biopsy forceps. Resected and retrieved.                           - Diverticulosis in the recto-sigmoid colon and in                            the sigmoid colon.                           - External and internal hemorrhoids.                           - Tortuous ANGULATED RECTOSIGMOID/LEFT colon. Moderate Sedation:      Moderate (conscious) sedation was administered by the endoscopy nurse       and supervised by the endoscopist. The following parameters were       monitored: oxygen saturation, heart rate, blood pressure, and response       to care. Total physician intraservice time was 41 minutes. Recommendation:           -  Patient has a contact number available for                            emergencies. The signs and symptoms of potential                            delayed complications were discussed with the                            patient. Return to normal activities tomorrow.                            Written discharge instructions were provided to the                            patient.                           - High fiber diet.                           - Continue present medications.                           - Await pathology results.                           - Repeat colonoscopy in 3 years for surveillance                            WITH ULTRASLIM SCOPE AND PROPOFOL. Procedure Code(s):        --- Professional ---                           [phone], Colonoscopy, flexible; with removal of                            tumor(s), polyp(s), or  other lesion(s) by snare                            technique                           45380, 59, Colonoscopy, flexible; with biopsy,                            single or multiple                           99153, Moderate sedation; each additional 15                            minutes intraservice time                           99153, Moderate sedation; each additional 15  minutes intraservice time                           G0500, Moderate sedation services provided by the                            same physician or other qualified health care                            professional performing a gastrointestinal                            endoscopic service that sedation supports,                            requiring the presence of an independent trained                            observer to assist in the monitoring of the                            patient's level of consciousness and physiological                            status; initial 15 minutes of intra-service time;                            patient age 68 years or older (additional time may                            be reported with [phone], as appropriate) Diagnosis Code(s):        --- Professional ---                           Z12.11, Encounter for screening for malignant                            neoplasm of colon                           K63.5, Polyp of colon                           K62.1, Rectal polyp                           K64.8, Other hemorrhoids                           K57.30, Diverticulosis of large intestine without                            perforation or abscess without bleeding                           Q43.8, Other specified congenital malformations of  intestine CPT copyright 2019 American Medical Association. All rights reserved. The codes documented in this report are preliminary and upon coder review may  be revised to meet current compliance  requirements. Barney Drain, MD Barney Drain MD, MD 12/13/2018 12:58:40 PM This report has been signed electronically. Number of Addenda: 0

## 2018-12-13 NOTE — H&P (Signed)
Primary Care Physician:  Scherrie Bateman Primary Gastroenterologist:  Dr. Oneida Alar  Pre-Procedure History & Physical: HPI:  Candice Nelson is a 63 y.o. female here for [place].  Past Medical History:  Diagnosis Date  . Breast cancer ([place]) 2013   right breast  . Depression   . Personal history of malignant neoplasm of breast   . Personal history of radiation therapy     Past Surgical History:  Procedure Laterality Date  . ABDOMINAL HYSTERECTOMY  2007  . BREAST EXCISIONAL BIOPSY Right 10/2011   breast ca radation  . BREAST LUMPECTOMY Right 2013  . BREAST SURGERY Right 2013   T1c N0, M0, ER positive, PR indeterminate, no HER-2/neu amplification  . COLON SURGERY  2006   perforated bowel   . COLONOSCOPY    . HEEL SPUR SURGERY Left 2010  . uterine ablation   2005    Prior to Admission medications   Medication Sig Start Date End Date Taking? Authorizing Provider  acetaminophen (TYLENOL) 500 MG tablet Take 500-1,000 mg by mouth 2 (two) times daily as needed for moderate pain or headache.   Yes [provider]  buPROPion (WELLBUTRIN XL) 300 MG 24 hr tablet Take 300 mg by mouth daily.   Yes [provider]  diphenhydramine-acetaminophen (TYLENOL PM) 25-500 MG TABS tablet Take 0.5-1 tablets by mouth at bedtime as needed (sleep).   Yes [provider]  Soft Lens Products (BIOTRUE) SOLN Place 1 drop into both eyes 2 (two) times daily as needed (dry eyes).   Yes [provider]    Allergies as of 11/22/2018 - Review Complete 11/18/2018  Allergen Reaction Noted  . Demerol [meperidine] Nausea Only 08/16/2012  . Morphine and related Hives 11/18/2018  . Percocet [oxycodone-acetaminophen] Nausea Only 08/16/2012  . Shellfish allergy Nausea Only 08/16/2012  . Valium [diazepam] Nausea Only 08/16/2012  . Penicillins Rash 08/16/2012  . Sulfa antibiotics Rash 11/18/2018    Family History  Problem Relation Age of Onset  . Breast  cancer Paternal Aunt     Social History   Socioeconomic History  . Marital status: Married    Spouse name: Not on file  . Number of children: Not on file  . Years of education: Not on file  . Highest education level: Not on file  Occupational History  . Not on file  Social Needs  . Financial resource strain: Not on file  . Food insecurity    Worry: Not on file    Inability: Not on file  . Transportation needs    Medical: Not on file    Non-medical: Not on file  Tobacco Use  . Smoking status: Former Smoker    Packs/day: 1.00    Years: 2.00    Pack years: 2.00  . Smokeless tobacco: Never Used  Substance and Sexual Activity  . Alcohol use: No  . Drug use: No  . Sexual activity: Not on file  Lifestyle  . Physical activity    Days per week: Not on file    Minutes per session: Not on file  . Stress: Not on file  Relationships  . Social Herbalist on phone: Not on file    Gets together: Not on file    Attends religious service: Not on file    Active member of club or organization: Not on file    Attends meetings of clubs or organizations: Not on file    Relationship status: Not on file  .  Intimate partner violence    Fear of current or ex partner: Not on file    Emotionally abused: Not on file    Physically abused: Not on file    Forced sexual activity: Not on file  Other Topics Concern  . Not on file  Social History Narrative  . Not on file    Review of Systems: See HPI, otherwise negative ROS   Physical Exam: BP 138/79   Pulse 68   Temp 98.2 F (36.8 C) (Oral)   Resp 17   Ht _0  (1.676 m)   Wt 62.6 kg   SpO2 97%   BMI 22.27 kg/m  General:   Alert,  pleasant and cooperative in NAD Head:  Normocephalic and atraumatic. Neck:  Supple; Lungs:  Clear throughout to auscultation.    Heart:  Regular rate and rhythm. Abdomen:  Soft, nontender and nondistended. Normal bowel sounds, without guarding, and without rebound.   Neurologic:  Alert and   oriented x4;  grossly normal neurologically.  Impression/Plan:    SCREENING  Plan:  1. TCS TODAY DISCUSSED PROCEDURE, BENEFITS, & RISKS: < 1% chance of medication reaction, bleeding, perforation, ASPIRATION, or rupture of spleen/liver requiring surgery to fix it and missed polyps < 1 cm 10-20% of the time.

## 2018-12-15 ENCOUNTER — Encounter (HOSPITAL_COMMUNITY): Payer: Self-pay | Admitting: Gastroenterology

## 2018-12-15 NOTE — Progress Notes (Signed)
CC'D TO PCP AND ON RECALL  °

## 2018-12-27 ENCOUNTER — Other Ambulatory Visit: Payer: Self-pay | Admitting: Family Medicine

## 2018-12-27 DIAGNOSIS — Z1231 Encounter for screening mammogram for malignant neoplasm of breast: Secondary | ICD-10-CM

## 2019-01-31 ENCOUNTER — Ambulatory Visit
Admission: RE | Admit: 2019-01-31 | Discharge: 2019-01-31 | Disposition: A | Discharge status: Home / Self Care | Payer: 59 | Source: Ambulatory Visit | Attending: Family Medicine | Admitting: Family Medicine

## 2019-01-31 DIAGNOSIS — Z1231 Encounter for screening mammogram for malignant neoplasm of breast: Secondary | ICD-10-CM | POA: Diagnosis present

## 2020-01-24 ENCOUNTER — Other Ambulatory Visit: Payer: Self-pay | Admitting: Internal Medicine

## 2020-01-24 DIAGNOSIS — Z1231 Encounter for screening mammogram for malignant neoplasm of breast: Secondary | ICD-10-CM

## 2020-02-23 ENCOUNTER — Other Ambulatory Visit: Payer: Self-pay

## 2020-02-23 ENCOUNTER — Ambulatory Visit
Admission: RE | Admit: 2020-02-23 | Discharge: 2020-02-23 | Disposition: A | Discharge status: Home / Self Care | Payer: No Typology Code available for payment source | Source: Ambulatory Visit | Attending: Internal Medicine | Admitting: Internal Medicine

## 2020-02-23 DIAGNOSIS — Z1231 Encounter for screening mammogram for malignant neoplasm of breast: Secondary | ICD-10-CM

## 2020-06-12 ENCOUNTER — Other Ambulatory Visit (HOSPITAL_COMMUNITY): Payer: Self-pay | Admitting: Family Medicine

## 2020-06-12 DIAGNOSIS — Z13820 Encounter for screening for osteoporosis: Secondary | ICD-10-CM

## 2020-07-04 ENCOUNTER — Ambulatory Visit (HOSPITAL_COMMUNITY)
Admission: RE | Admit: 2020-07-04 | Discharge: 2020-07-04 | Disposition: A | Discharge status: Home / Self Care | Payer: No Typology Code available for payment source | Source: Ambulatory Visit | Attending: Family Medicine | Admitting: Family Medicine

## 2020-07-04 DIAGNOSIS — Z13820 Encounter for screening for osteoporosis: Secondary | ICD-10-CM | POA: Insufficient documentation

## 2021-06-19 DIAGNOSIS — E039 Hypothyroidism, unspecified: Secondary | ICD-10-CM | POA: Diagnosis not present

## 2021-06-19 DIAGNOSIS — N390 Urinary tract infection, site not specified: Secondary | ICD-10-CM | POA: Diagnosis not present

## 2021-06-19 DIAGNOSIS — Z6822 Body mass index (BMI) 22.0-22.9, adult: Secondary | ICD-10-CM | POA: Diagnosis not present

## 2021-06-19 DIAGNOSIS — K529 Noninfective gastroenteritis and colitis, unspecified: Secondary | ICD-10-CM | POA: Diagnosis not present

## 2021-06-19 DIAGNOSIS — R748 Abnormal levels of other serum enzymes: Secondary | ICD-10-CM | POA: Diagnosis not present

## 2021-06-19 DIAGNOSIS — E663 Overweight: Secondary | ICD-10-CM | POA: Diagnosis not present

## 2021-06-19 DIAGNOSIS — H8110 Benign paroxysmal vertigo, unspecified ear: Secondary | ICD-10-CM | POA: Diagnosis not present

## 2021-06-19 DIAGNOSIS — Z1331 Encounter for screening for depression: Secondary | ICD-10-CM | POA: Diagnosis not present

## 2021-06-20 ENCOUNTER — Other Ambulatory Visit (HOSPITAL_COMMUNITY): Payer: Self-pay | Admitting: Physician Assistant

## 2021-06-20 ENCOUNTER — Encounter: Payer: Self-pay | Admitting: Internal Medicine

## 2021-06-20 ENCOUNTER — Other Ambulatory Visit: Payer: Self-pay | Admitting: Physician Assistant

## 2021-06-20 DIAGNOSIS — K529 Noninfective gastroenteritis and colitis, unspecified: Secondary | ICD-10-CM

## 2021-06-20 DIAGNOSIS — R748 Abnormal levels of other serum enzymes: Secondary | ICD-10-CM

## 2021-06-21 DIAGNOSIS — N390 Urinary tract infection, site not specified: Secondary | ICD-10-CM | POA: Diagnosis not present

## 2021-06-21 DIAGNOSIS — K529 Noninfective gastroenteritis and colitis, unspecified: Secondary | ICD-10-CM | POA: Diagnosis not present

## 2021-06-21 DIAGNOSIS — H8110 Benign paroxysmal vertigo, unspecified ear: Secondary | ICD-10-CM | POA: Diagnosis not present

## 2021-07-04 ENCOUNTER — Other Ambulatory Visit: Payer: Self-pay

## 2021-07-04 ENCOUNTER — Ambulatory Visit (HOSPITAL_COMMUNITY)
Admission: RE | Admit: 2021-07-04 | Discharge: 2021-07-04 | Disposition: A | Discharge status: Home / Self Care | Payer: Medicare HMO | Source: Ambulatory Visit | Attending: Physician Assistant | Admitting: Physician Assistant

## 2021-07-04 DIAGNOSIS — R7989 Other specified abnormal findings of blood chemistry: Secondary | ICD-10-CM | POA: Diagnosis not present

## 2021-07-04 DIAGNOSIS — R748 Abnormal levels of other serum enzymes: Secondary | ICD-10-CM | POA: Diagnosis not present

## 2021-07-04 DIAGNOSIS — K529 Noninfective gastroenteritis and colitis, unspecified: Secondary | ICD-10-CM | POA: Insufficient documentation

## 2021-07-10 DIAGNOSIS — L570 Actinic keratosis: Secondary | ICD-10-CM | POA: Diagnosis not present

## 2021-07-11 ENCOUNTER — Other Ambulatory Visit (HOSPITAL_COMMUNITY): Payer: Self-pay | Admitting: Physician Assistant

## 2021-07-11 DIAGNOSIS — R748 Abnormal levels of other serum enzymes: Secondary | ICD-10-CM

## 2021-07-11 DIAGNOSIS — K838 Other specified diseases of biliary tract: Secondary | ICD-10-CM

## 2021-07-17 ENCOUNTER — Encounter (HOSPITAL_COMMUNITY)
Admission: RE | Admit: 2021-07-17 | Discharge: 2021-07-17 | Disposition: A | Discharge status: Home / Self Care | Payer: Medicare HMO | Source: Ambulatory Visit | Attending: Physician Assistant | Admitting: Physician Assistant

## 2021-07-17 DIAGNOSIS — R748 Abnormal levels of other serum enzymes: Secondary | ICD-10-CM | POA: Insufficient documentation

## 2021-07-17 DIAGNOSIS — R7989 Other specified abnormal findings of blood chemistry: Secondary | ICD-10-CM | POA: Diagnosis not present

## 2021-07-17 DIAGNOSIS — K838 Other specified diseases of biliary tract: Secondary | ICD-10-CM | POA: Insufficient documentation

## 2021-07-17 DIAGNOSIS — R945 Abnormal results of liver function studies: Secondary | ICD-10-CM | POA: Diagnosis not present

## 2021-07-17 MED ORDER — TECHNETIUM TC 99M MEBROFENIN IV KIT
5.0000 | PACK | Freq: Once | INTRAVENOUS | Status: AC | PRN
  Administered 2021-07-17: 4.8 via INTRAVENOUS

## 2021-07-18 ENCOUNTER — Ambulatory Visit: Payer: Medicare HMO | Admitting: Internal Medicine

## 2021-07-18 ENCOUNTER — Encounter: Payer: Self-pay | Admitting: Internal Medicine

## 2021-07-18 VITALS — BP 116/60 | HR 78 | Temp 97.0°F | Ht 65.0 in | Wt 137.4 lb

## 2021-07-18 DIAGNOSIS — R197 Diarrhea, unspecified: Secondary | ICD-10-CM

## 2021-07-18 DIAGNOSIS — K219 Gastro-esophageal reflux disease without esophagitis: Secondary | ICD-10-CM | POA: Diagnosis not present

## 2021-07-18 DIAGNOSIS — R7989 Other specified abnormal findings of blood chemistry: Secondary | ICD-10-CM | POA: Diagnosis not present

## 2021-07-18 NOTE — Patient Instructions (Addendum)
I am happy to hear that you are feeling better. ? 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 ? ?I am going to order a blood test to rule out celiac disease or gluten insensitivity to be performed at [facility].  We will call you with these results. ? ?I will also print out a low FODMAP diet which may give you insight on certain foods to avoid. ? ?Would recommend at least 25 to 35 g of fiber daily.  If you are not obtaining this to your diet then I would recommend over-the-counter Benefiber or Metamucil. ? ?Can take Imodium as needed. ? ?We will plan on colonoscopy 2025 for surveillance purposes given your history of polyps. ? ?Follow-up with GI in 3 to 4 months. ? ?It was very nice meeting you today. ? ?Dr. Abbey Chatters ? ?At [facility] we value your feedback. You may receive a survey about your visit today. Please share your experience as we strive to create trusting relationships with our patients to provide genuine, compassionate, quality care. ? ?We appreciate your understanding and patience as we review any laboratory studies, imaging, and other diagnostic tests that are ordered as we care for you. Our office policy is 5 business days for review of these results, and any emergent or urgent results are addressed in a timely manner for your best interest. If you do not hear from our office in 1 week, please contact us.  ? ?We also encourage the use of MyChart, which contains your medical information for your review as well. If you are not enrolled in this feature, an access code is on this after visit summary for your convenience. Thank you for allowing [place] to be involved in your care. ? ?It was great to see you today!  I hope you have a great rest of your Spring! ? ? ? ?Candice Nelson. Abbey Chatters, D.O. ?Gastroenterology and Hepatology ?[facility] ? ?

## 2021-07-18 NOTE — Progress Notes (Signed)
? ? ?Primary Care Physician:  Jake Samples, PA-C ?Primary Gastroenterologist:  Dr. Abbey Chatters ? ?Chief Complaint  ?Patient presents with  ? Diarrhea  ?  Referred for chronic diarrhea, no episode in 2 weeks  ? ? ?HPI:   ?Candice Nelson is a 66 y.o. female who presents to clinic today by referral from her PCP Delman Cheadle for evaluation.  Patient states for 2 years she has had chronic diarrhea.  Notes this occurs once every 2 to 3 days multiple loose bowel movements.  Notes urgency as well having to run to the bathroom at times.  No incontinence.  No abdominal pain.  No melena hematochezia.  No unintentional weight loss. ? ?She states her symptoms resolved approximately 2 to 3 weeks ago.  Does note recently retiring and has had much less stress in her life.  Has been trying to pinpoint certain foods that trigger her symptoms. ? ?Underwent right upper quadrant ultrasound 07/04/2021 which showed borderline to slightly enlarged common bile duct otherwise unremarkable.  HIDA scan 07/17/2021 normal study. ? ?Last colonoscopy August 2020 with multiple polyps removed.  Pathology showed serrated polyps as well as a few hyperplastic, recommended 5-year recall. ? ?This note reflux as well.  This is very mild, intermittent in nature.  No dysphagia odynophagia.  No epigastric or chest pain. ? ?Past Medical History:  ?Diagnosis Date  ? Breast cancer ([place]) 2013  ? right breast  ? Depression   ? Personal history of malignant neoplasm of breast   ? Personal history of radiation therapy   ? ? ?Past Surgical History:  ?Procedure Laterality Date  ? ABDOMINAL HYSTERECTOMY  2007  ? BREAST EXCISIONAL BIOPSY Right 10/2011  ? breast ca radation  ? BREAST LUMPECTOMY Right 2013  ? BREAST SURGERY Right 2013  ? T1c N0, M0, ER positive, PR indeterminate, no HER-2/neu amplification  ? COLON SURGERY  2006  ? perforated bowel   ? COLONOSCOPY    ? COLONOSCOPY N/A 12/13/2018  ? Procedure: COLONOSCOPY;  Surgeon: Danie Binder, MD;  [facility]: [facility];  Service: Endoscopy;  Laterality: N/A;  12:00  ? HEEL SPUR SURGERY Left 2010  ? POLYPECTOMY  12/13/2018  ? Procedure: POLYPECTOMY;  Surgeon: Danie Binder, MD;  Location: [facility];  Service: Endoscopy;;  ? uterine ablation   2005  ? ? ?Current Outpatient Medications  ?Medication Sig Dispense Refill  ? acetaminophen (TYLENOL) 500 MG tablet Take 500-1,000 mg by mouth 2 (two) times daily as needed for moderate pain or headache.    ? buPROPion (WELLBUTRIN XL) 300 MG 24 hr tablet Take 300 mg by mouth daily.    ? diphenhydramine-acetaminophen (TYLENOL PM) 25-500 MG TABS tablet Take 0.5-1 tablets by mouth at bedtime as needed (sleep).    ? Soft Lens Products (BIOTRUE) SOLN Place 1 drop into both eyes 2 (two) times daily as needed (dry eyes).    ? ?No current facility-administered medications for this visit.  ? ? ?Allergies as of 07/18/2021 - Review Complete 07/18/2021  ?Allergen Reaction Noted  ? Demerol [meperidine] Nausea And Vomiting 08/16/2012  ? Morphine and related Hives 11/18/2018  ? Other Nausea And Vomiting 12/08/2018  ? Oysters [shellfish allergy] Nausea And Vomiting 08/16/2012  ? Percocet [oxycodone-acetaminophen] Nausea And Vomiting 08/16/2012  ? Valium [diazepam] Nausea Only 08/16/2012  ? Penicillins Hives and Rash 08/16/2012  ? Sulfa antibiotics Rash 11/18/2018  ? ? ?Family History  ?Problem Relation Age of Onset  ? Breast cancer Paternal Aunt   ? ? ?  Social History  ? ?Socioeconomic History  ? Marital status: Divorced  ?  Spouse name: Not on file  ? Number of children: Not on file  ? Years of education: Not on file  ? Highest education level: Not on file  ?Occupational History  ? Not on file  ?Tobacco Use  ? Smoking status: Former  ?  Packs/day: 1.00  ?  Years: 2.00  ?  Pack years: 2.00  ?  Types: Cigarettes  ? Smokeless tobacco: Never  ?Substance and Sexual Activity  ? Alcohol use: No  ? Drug use: No  ? Sexual activity: Not on file  ?Other Topics Concern  ? Not on file  ?Social History  Narrative  ? Not on file  ? ?Social Determinants of Health  ? ?Financial Resource Strain: Not on file  ?Food Insecurity: Not on file  ?Transportation Needs: Not on file  ?Physical Activity: Not on file  ?Stress: Not on file  ?Social Connections: Not on file  ?Intimate Partner Violence: Not on file  ? ? ?Subjective: ?Review of Systems  ?Constitutional:  Negative for chills and fever.  ?HENT:  Negative for congestion and hearing loss.   ?Eyes:  Negative for blurred vision and double vision.  ?Respiratory:  Negative for cough and shortness of breath.   ?Cardiovascular:  Negative for chest pain and palpitations.  ?Gastrointestinal:  Positive for diarrhea. Negative for abdominal pain, blood in stool, constipation, heartburn, melena and vomiting.  ?Genitourinary:  Negative for dysuria and urgency.  ?Musculoskeletal:  Negative for joint pain and myalgias.  ?Skin:  Negative for itching and rash.  ?Neurological:  Negative for dizziness and headaches.  ?Psychiatric/Behavioral:  Negative for depression. The patient is not nervous/anxious.    ? ? ? ?Objective: ?BP 116/60   Pulse 78   Temp (!) 97 ?F (36.1 ?C)   Ht 5' 5" (1.651 m)   Wt 137 lb 6.4 oz (62.3 kg)   BMI 22.86 kg/m?  ?Physical Exam ?Constitutional:   ?   Appearance: Normal appearance.  ?HENT:  ?   Head: Normocephalic and atraumatic.  ?Eyes:  ?   Extraocular Movements: Extraocular movements intact.  ?   Conjunctiva/sclera: Conjunctivae normal.  ?Cardiovascular:  ?   Rate and Rhythm: Normal rate and regular rhythm.  ?Pulmonary:  ?   Effort: Pulmonary effort is normal.  ?   Breath sounds: Normal breath sounds.  ?Abdominal:  ?   General: Bowel sounds are normal.  ?   Palpations: Abdomen is soft.  ?Musculoskeletal:     ?   General: No swelling. Normal range of motion.  ?   Cervical back: Normal range of motion and neck supple.  ?Skin: ?   General: Skin is warm and dry.  ?   Coloration: Skin is not jaundiced.  ?Neurological:  ?   General: No focal deficit present.  ?    Mental Status: She is alert and oriented to person, place, and time.  ?Psychiatric:     ?   Mood and Affect: Mood normal.     ?   Behavior: Behavior normal.  ? ? ? ?Assessment: ?*Diarrhea-chronic, improved ?*GERD, mild, intermittent ?*Borderline enlarged common bile duct ? ?Plan: ?Etiology of patient's diarrhea unclear.  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  Notes for the past 2 weeks she has had no symptoms.  Does note recently retired which has decreased a lot of stress in her life. ? ?We will check celiac antibodies to rule out gluten insensitivity.  TSH WNL.   ? ?  We will request most recent blood work and stool studies from [facility]. ? ?I have printed a low FODMAP diet for patient today and discussed in detail. ? ?Also recommended 25 to 35 g of fiber daily.  If not obtaining this through dietary means then would recommend over-the-counter Benefiber/Metamucil. ? ?Can take Imodium as needed. ? ?Colonoscopy recall 2025 due to history of serrated polyps. ? ?GERD mild, intermittent, no alarm symptoms today.  Continue to monitor.  Tums as needed. ? ?Unclear etiology of patient's borderline enlarged common bile duct.  Reported abnormal LFTs in the past though I do not have these labs.  Requested as above.  We will recheck hepatic function panel today.  May need MRCP in the future. ? ?Follow-up in 3 to 4 months. ? ?07/18/2021 10:08 AM ? ? ?Disclaimer: This note was dictated with voice recognition software. Similar sounding words can inadvertently be transcribed and may not be corrected upon review. ? ?

## 2021-07-21 LAB — HEPATIC FUNCTION PANEL
ALT: 36 IU/L — ABNORMAL HIGH (ref 0–32)
AST: 24 IU/L (ref 0–40)
Albumin: 4 g/dL (ref 3.8–4.8)
Alkaline Phosphatase: 103 IU/L (ref 44–121)
Bilirubin Total: <0.2 mg/dL (ref 0.0–1.2)
Bilirubin, Direct: <0.1 mg/dL (ref 0.00–0.40)
Total Protein: 6.3 g/dL (ref 6.0–8.5)

## 2021-07-21 LAB — CELIAC AB TTG DGP TIGA
Antigliadin Abs, IgA: 3 units (ref 0–19)
Gliadin IgG: 4 units (ref 0–19)
IgA/Immunoglobulin A, Serum: 193 mg/dL (ref 87–352)
Tissue Transglut Ab: <2 U/mL (ref 0–5)
Transglutaminase IgA: <2 U/mL (ref 0–3)

## 2021-10-28 ENCOUNTER — Encounter: Payer: Self-pay | Admitting: Internal Medicine

## 2021-10-31 DIAGNOSIS — M65332 Trigger finger, left middle finger: Secondary | ICD-10-CM | POA: Diagnosis not present

## 2021-11-11 DIAGNOSIS — M65332 Trigger finger, left middle finger: Secondary | ICD-10-CM | POA: Diagnosis not present

## 2022-04-11 DIAGNOSIS — Z6822 Body mass index (BMI) 22.0-22.9, adult: Secondary | ICD-10-CM | POA: Diagnosis not present

## 2022-04-11 DIAGNOSIS — M25572 Pain in left ankle and joints of left foot: Secondary | ICD-10-CM | POA: Diagnosis not present

## 2022-04-11 DIAGNOSIS — M75111 Incomplete rotator cuff tear or rupture of right shoulder, not specified as traumatic: Secondary | ICD-10-CM | POA: Diagnosis not present

## 2022-04-16 DIAGNOSIS — M67872 Other specified disorders of synovium, left ankle and foot: Secondary | ICD-10-CM | POA: Diagnosis not present

## 2022-04-16 DIAGNOSIS — M25572 Pain in left ankle and joints of left foot: Secondary | ICD-10-CM | POA: Diagnosis not present

## 2022-05-06 ENCOUNTER — Other Ambulatory Visit (HOSPITAL_COMMUNITY): Payer: Self-pay | Admitting: Family Medicine

## 2022-05-06 DIAGNOSIS — Z0001 Encounter for general adult medical examination with abnormal findings: Secondary | ICD-10-CM | POA: Diagnosis not present

## 2022-05-06 DIAGNOSIS — K838 Other specified diseases of biliary tract: Secondary | ICD-10-CM | POA: Diagnosis not present

## 2022-05-06 DIAGNOSIS — R748 Abnormal levels of other serum enzymes: Secondary | ICD-10-CM | POA: Diagnosis not present

## 2022-05-06 DIAGNOSIS — M75111 Incomplete rotator cuff tear or rupture of right shoulder, not specified as traumatic: Secondary | ICD-10-CM | POA: Diagnosis not present

## 2022-05-06 DIAGNOSIS — Z1231 Encounter for screening mammogram for malignant neoplasm of breast: Secondary | ICD-10-CM

## 2022-05-06 DIAGNOSIS — Z1331 Encounter for screening for depression: Secondary | ICD-10-CM | POA: Diagnosis not present

## 2022-05-06 DIAGNOSIS — E7801 Familial hypercholesterolemia: Secondary | ICD-10-CM | POA: Diagnosis not present

## 2022-05-06 DIAGNOSIS — Z6822 Body mass index (BMI) 22.0-22.9, adult: Secondary | ICD-10-CM | POA: Diagnosis not present

## 2022-05-08 ENCOUNTER — Ambulatory Visit (HOSPITAL_COMMUNITY)
Admission: RE | Admit: 2022-05-08 | Discharge: 2022-05-08 | Disposition: A | Discharge status: Home / Self Care | Payer: Medicare HMO | Source: Ambulatory Visit | Attending: Family Medicine | Admitting: Family Medicine

## 2022-05-08 DIAGNOSIS — Z1231 Encounter for screening mammogram for malignant neoplasm of breast: Secondary | ICD-10-CM

## 2022-05-09 LAB — HM MAMMOGRAPHY

## 2022-06-05 DIAGNOSIS — M7662 Achilles tendinitis, left leg: Secondary | ICD-10-CM | POA: Diagnosis not present

## 2022-08-15 DIAGNOSIS — E7849 Other hyperlipidemia: Secondary | ICD-10-CM | POA: Diagnosis not present

## 2022-08-15 DIAGNOSIS — R748 Abnormal levels of other serum enzymes: Secondary | ICD-10-CM | POA: Diagnosis not present

## 2022-11-04 DIAGNOSIS — D485 Neoplasm of uncertain behavior of skin: Secondary | ICD-10-CM | POA: Diagnosis not present

## 2022-11-04 DIAGNOSIS — L570 Actinic keratosis: Secondary | ICD-10-CM | POA: Diagnosis not present

## 2022-11-17 DIAGNOSIS — Z880 Allergy status to penicillin: Secondary | ICD-10-CM | POA: Diagnosis not present

## 2022-11-17 DIAGNOSIS — F325 Major depressive disorder, single episode, in full remission: Secondary | ICD-10-CM | POA: Diagnosis not present

## 2022-11-17 DIAGNOSIS — E785 Hyperlipidemia, unspecified: Secondary | ICD-10-CM | POA: Diagnosis not present

## 2022-11-17 DIAGNOSIS — Z853 Personal history of malignant neoplasm of breast: Secondary | ICD-10-CM | POA: Diagnosis not present

## 2022-11-17 DIAGNOSIS — Z8249 Family history of ischemic heart disease and other diseases of the circulatory system: Secondary | ICD-10-CM | POA: Diagnosis not present

## 2022-11-17 DIAGNOSIS — Z823 Family history of stroke: Secondary | ICD-10-CM | POA: Diagnosis not present

## 2022-11-17 DIAGNOSIS — Z809 Family history of malignant neoplasm, unspecified: Secondary | ICD-10-CM | POA: Diagnosis not present

## 2022-11-17 DIAGNOSIS — Z008 Encounter for other general examination: Secondary | ICD-10-CM | POA: Diagnosis not present

## 2022-11-17 DIAGNOSIS — Z87891 Personal history of nicotine dependence: Secondary | ICD-10-CM | POA: Diagnosis not present

## 2022-11-17 DIAGNOSIS — R030 Elevated blood-pressure reading, without diagnosis of hypertension: Secondary | ICD-10-CM | POA: Diagnosis not present

## 2022-11-17 DIAGNOSIS — Z833 Family history of diabetes mellitus: Secondary | ICD-10-CM | POA: Diagnosis not present

## 2023-01-05 DIAGNOSIS — L570 Actinic keratosis: Secondary | ICD-10-CM | POA: Diagnosis not present

## 2023-01-05 DIAGNOSIS — D485 Neoplasm of uncertain behavior of skin: Secondary | ICD-10-CM | POA: Diagnosis not present

## 2023-02-03 DIAGNOSIS — Z6822 Body mass index (BMI) 22.0-22.9, adult: Secondary | ICD-10-CM | POA: Diagnosis not present

## 2023-02-03 DIAGNOSIS — M25561 Pain in right knee: Secondary | ICD-10-CM | POA: Diagnosis not present

## 2023-03-03 DIAGNOSIS — M25561 Pain in right knee: Secondary | ICD-10-CM | POA: Diagnosis not present

## 2023-04-29 DIAGNOSIS — M25561 Pain in right knee: Secondary | ICD-10-CM | POA: Diagnosis not present

## 2023-04-30 DIAGNOSIS — L821 Other seborrheic keratosis: Secondary | ICD-10-CM | POA: Diagnosis not present

## 2023-04-30 DIAGNOSIS — L570 Actinic keratosis: Secondary | ICD-10-CM | POA: Diagnosis not present

## 2023-04-30 DIAGNOSIS — D485 Neoplasm of uncertain behavior of skin: Secondary | ICD-10-CM | POA: Diagnosis not present

## 2023-04-30 DIAGNOSIS — L820 Inflamed seborrheic keratosis: Secondary | ICD-10-CM | POA: Diagnosis not present

## 2023-04-30 DIAGNOSIS — L723 Sebaceous cyst: Secondary | ICD-10-CM | POA: Diagnosis not present

## 2023-05-08 DIAGNOSIS — F419 Anxiety disorder, unspecified: Secondary | ICD-10-CM | POA: Diagnosis not present

## 2023-05-08 DIAGNOSIS — Z1331 Encounter for screening for depression: Secondary | ICD-10-CM | POA: Diagnosis not present

## 2023-05-08 DIAGNOSIS — E782 Mixed hyperlipidemia: Secondary | ICD-10-CM | POA: Diagnosis not present

## 2023-05-08 DIAGNOSIS — Z0001 Encounter for general adult medical examination with abnormal findings: Secondary | ICD-10-CM | POA: Diagnosis not present

## 2023-05-08 DIAGNOSIS — Z6822 Body mass index (BMI) 22.0-22.9, adult: Secondary | ICD-10-CM | POA: Diagnosis not present

## 2023-05-08 DIAGNOSIS — M25572 Pain in left ankle and joints of left foot: Secondary | ICD-10-CM | POA: Diagnosis not present

## 2023-05-08 DIAGNOSIS — R7309 Other abnormal glucose: Secondary | ICD-10-CM | POA: Diagnosis not present

## 2023-05-08 DIAGNOSIS — E7849 Other hyperlipidemia: Secondary | ICD-10-CM | POA: Diagnosis not present

## 2023-05-08 DIAGNOSIS — R748 Abnormal levels of other serum enzymes: Secondary | ICD-10-CM | POA: Diagnosis not present

## 2023-05-08 DIAGNOSIS — M25561 Pain in right knee: Secondary | ICD-10-CM | POA: Diagnosis not present

## 2023-05-14 DIAGNOSIS — L910 Hypertrophic scar: Secondary | ICD-10-CM | POA: Diagnosis not present

## 2023-05-14 DIAGNOSIS — D485 Neoplasm of uncertain behavior of skin: Secondary | ICD-10-CM | POA: Diagnosis not present

## 2023-05-20 DIAGNOSIS — M25561 Pain in right knee: Secondary | ICD-10-CM | POA: Diagnosis not present

## 2023-05-27 IMAGING — US US ABDOMEN LIMITED
2 of 3 series · 14 of 25 positions shown · non-contrast
Comparison: None.

CLINICAL DATA: Elevated liver enzyme

EXAM:
ULTRASOUND ABDOMEN LIMITED RIGHT UPPER QUADRANT

[Series 1: us abdomen limited · 0.17mm/px · 13 of 47 slices shown (1 of 2)]
[im 1/47]
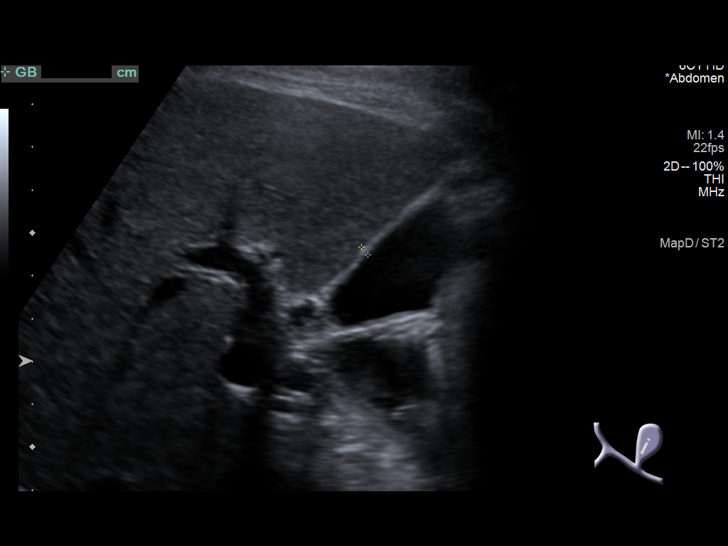
[im 5/47]
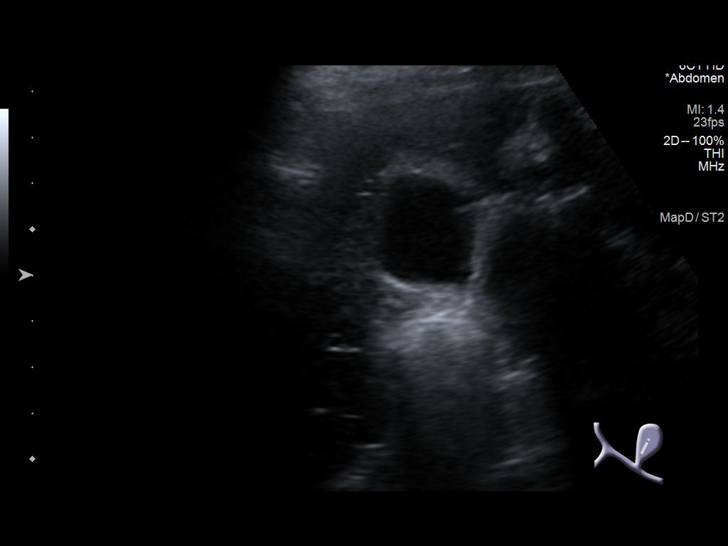
[im 9/47]
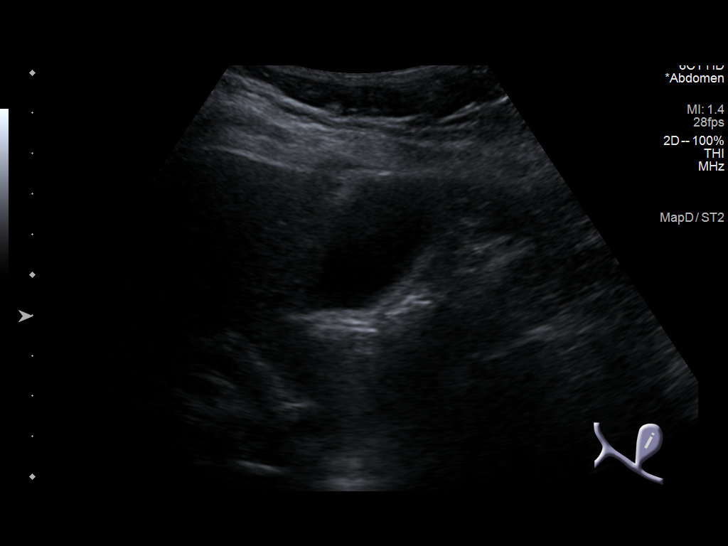
[im 13/47]
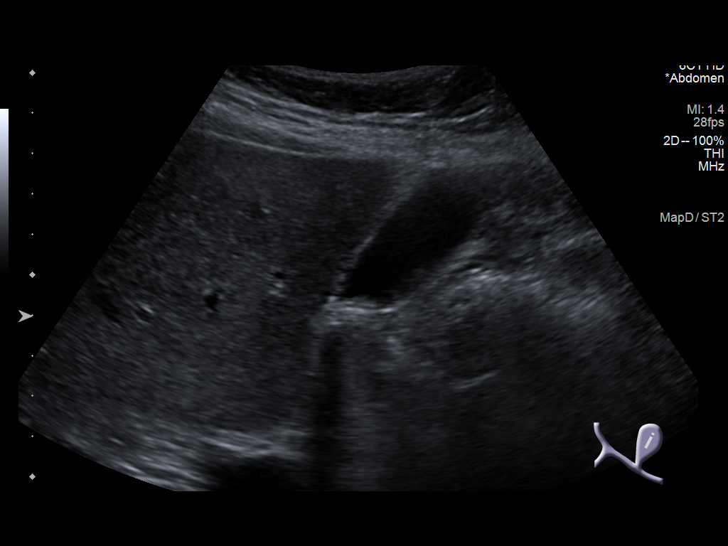
[im 17/47]
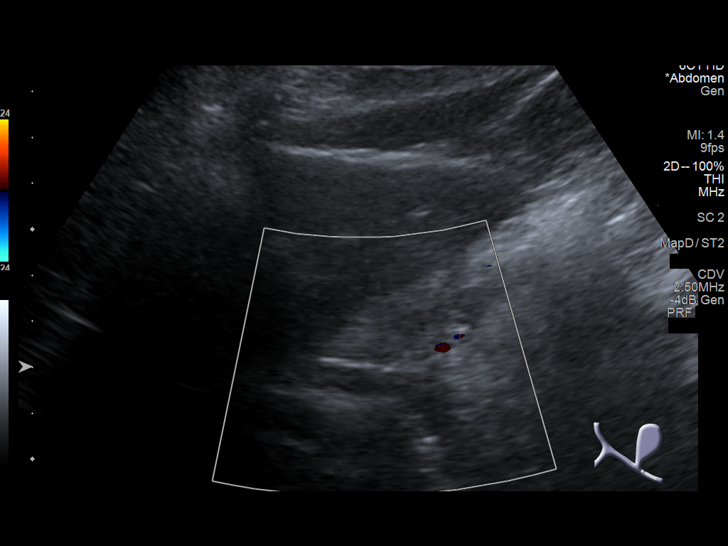
[im 19/47]
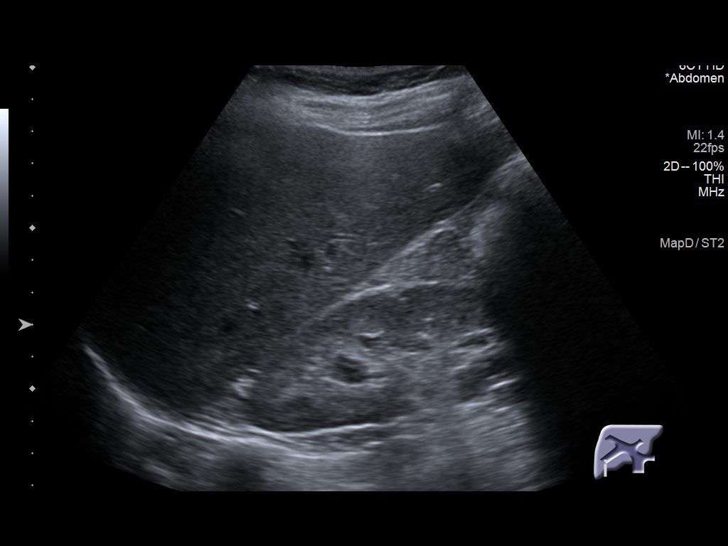
[im 24/47]
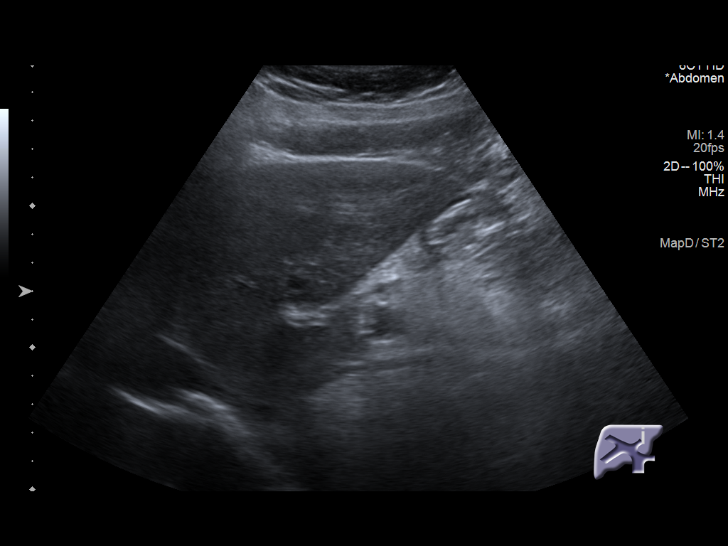
[im 28/47]
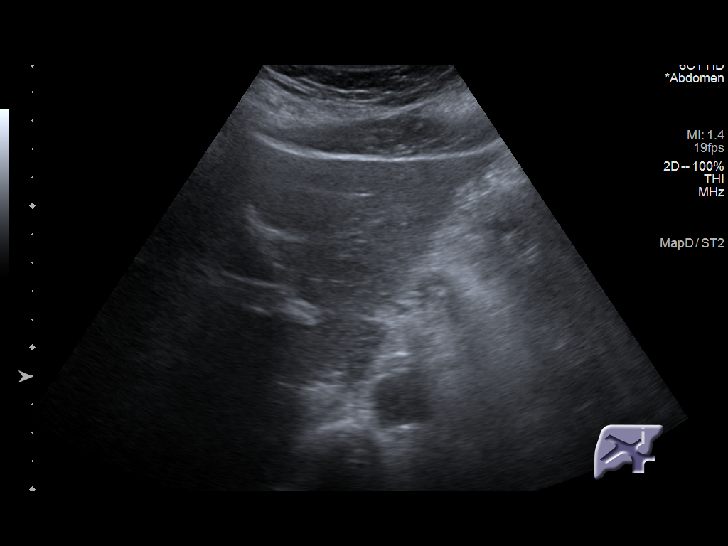
[im 32/47]
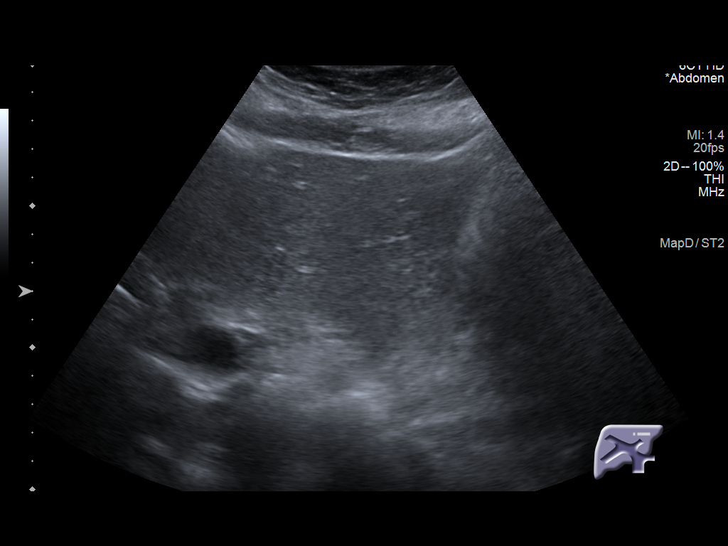
[im 34/47]
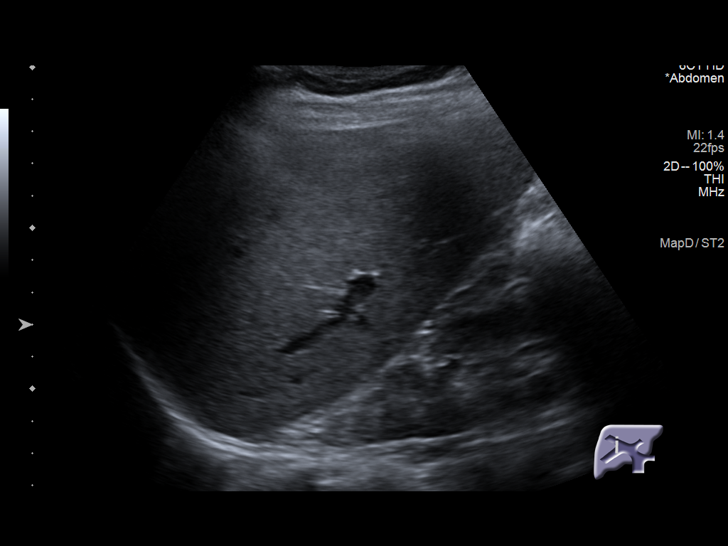
[im 38/47]
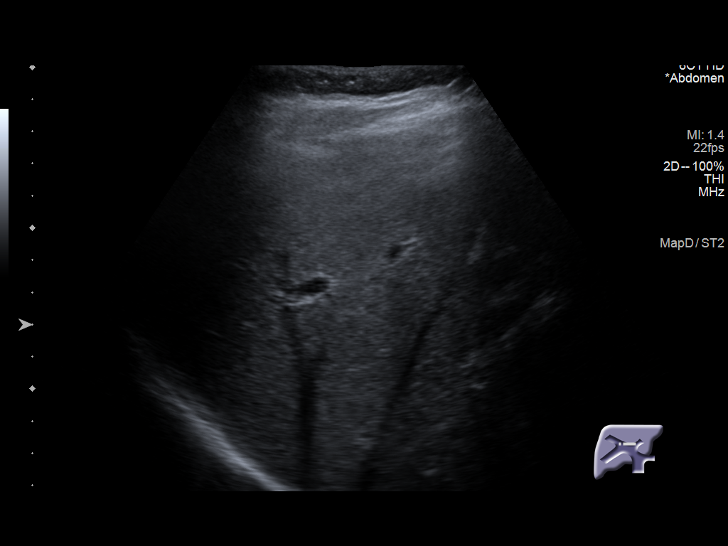
[im 42/47]
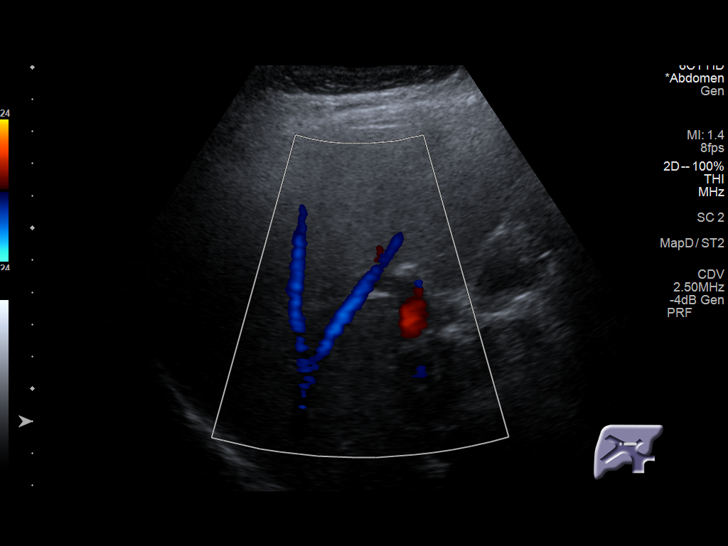
[im 47/47]
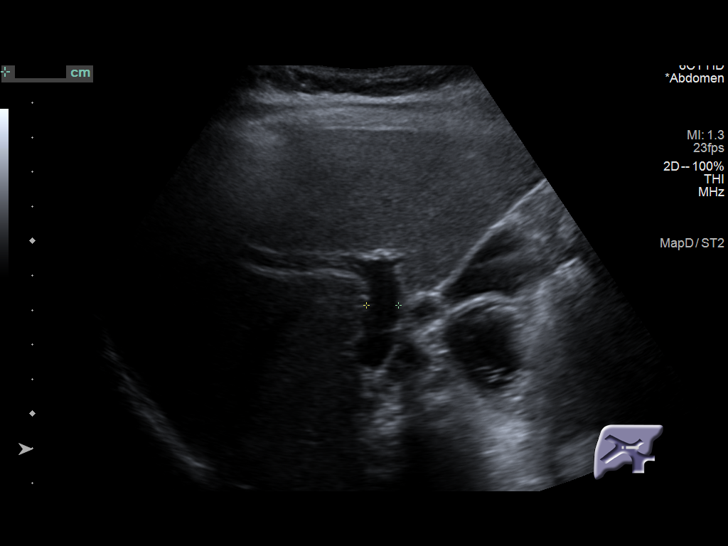

[Series 2002: us abdomen limited · 0.25mm/px · 1 of 1 slices shown (2 of 2)]
[im 1/1]
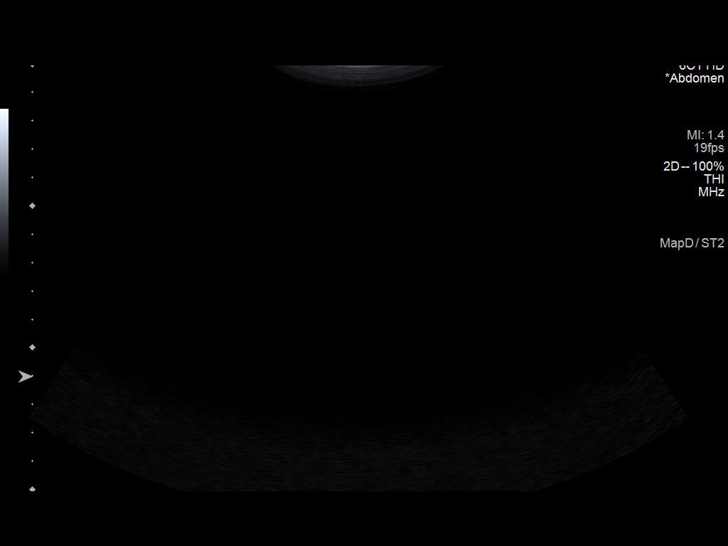

[14 of 25 positions shown; findings below may reference images not displayed]

FINDINGS: Gallbladder:

No gallstones or wall thickening visualized. No sonographic Murphy
sign noted by sonographer.

Common bile duct:

Diameter: 7 mm

Liver:

No focal lesion identified. Within normal limits in parenchymal
echogenicity. Portal vein is patent on color Doppler imaging with
normal direction of blood flow towards the liver.

Other: None.
IMPRESSION: 1. Borderline to slightly enlarged common bile duct. If ductal
obstruction is suspected, correlation with MRCP could be obtained.

## 2023-06-09 IMAGING — NM NM HEPATOBILIARY IMAGE, INC GB
1 series · 6 of 6 positions shown · non-contrast
Comparison: Ultrasound abdomen 07/04/2021

CLINICAL DATA: Abnormal ultrasound, elevated LFTs

EXAM:
NUCLEAR MEDICINE HEPATOBILIARY IMAGING
TECHNIQUE: Sequential images of the abdomen were obtained [DATE] minutes
following intravenous administration of radiopharmaceutical.
RADIOPHARMACEUTICALS:  4.8 mCi Qc-QQm  Choletec IV

[Series 1: biliary · 3.25mm/px · 6 of 60 frames shown]
[frame 6/60]
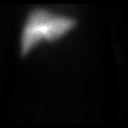
[frame 16/60]
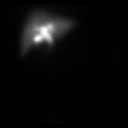
[frame 26/60]
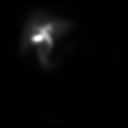
[frame 36/60]
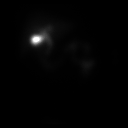
[frame 46/60]
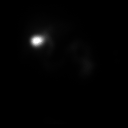
[frame 56/60]
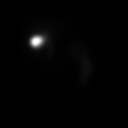

[6 of 6 positions shown; findings below may reference images not displayed]

FINDINGS: Normal tracer extraction from bloodstream indicating normal
hepatocellular function.

Prompt excretion of tracer into biliary tree.

Gallbladder visualized at 11 minutes.

Duodenum visualized at 21 minutes.

Good clearance of tracer over the course of the study without
abnormal hepatic retention.
IMPRESSION: Normal exam.

## 2023-06-10 DIAGNOSIS — M1711 Unilateral primary osteoarthritis, right knee: Secondary | ICD-10-CM | POA: Diagnosis not present

## 2023-07-20 DIAGNOSIS — G8918 Other acute postprocedural pain: Secondary | ICD-10-CM | POA: Diagnosis not present

## 2023-07-20 DIAGNOSIS — M1711 Unilateral primary osteoarthritis, right knee: Secondary | ICD-10-CM | POA: Diagnosis not present

## 2023-07-23 DIAGNOSIS — M25661 Stiffness of right knee, not elsewhere classified: Secondary | ICD-10-CM | POA: Diagnosis not present

## 2023-07-23 DIAGNOSIS — M25561 Pain in right knee: Secondary | ICD-10-CM | POA: Diagnosis not present

## 2023-07-27 DIAGNOSIS — M25561 Pain in right knee: Secondary | ICD-10-CM | POA: Diagnosis not present

## 2023-07-27 DIAGNOSIS — M25661 Stiffness of right knee, not elsewhere classified: Secondary | ICD-10-CM | POA: Diagnosis not present

## 2023-07-29 DIAGNOSIS — M25661 Stiffness of right knee, not elsewhere classified: Secondary | ICD-10-CM | POA: Diagnosis not present

## 2023-07-29 DIAGNOSIS — M25561 Pain in right knee: Secondary | ICD-10-CM | POA: Diagnosis not present

## 2023-07-31 DIAGNOSIS — M25561 Pain in right knee: Secondary | ICD-10-CM | POA: Diagnosis not present

## 2023-07-31 DIAGNOSIS — M25661 Stiffness of right knee, not elsewhere classified: Secondary | ICD-10-CM | POA: Diagnosis not present

## 2023-08-07 DIAGNOSIS — M25661 Stiffness of right knee, not elsewhere classified: Secondary | ICD-10-CM | POA: Diagnosis not present

## 2023-08-07 DIAGNOSIS — M25561 Pain in right knee: Secondary | ICD-10-CM | POA: Diagnosis not present

## 2023-09-03 DIAGNOSIS — Z4889 Encounter for other specified surgical aftercare: Secondary | ICD-10-CM | POA: Diagnosis not present

## 2023-11-11 DIAGNOSIS — L814 Other melanin hyperpigmentation: Secondary | ICD-10-CM | POA: Diagnosis not present

## 2023-11-11 DIAGNOSIS — L821 Other seborrheic keratosis: Secondary | ICD-10-CM | POA: Diagnosis not present

## 2023-11-11 DIAGNOSIS — I781 Nevus, non-neoplastic: Secondary | ICD-10-CM | POA: Diagnosis not present

## 2023-11-19 ENCOUNTER — Encounter (INDEPENDENT_AMBULATORY_CARE_PROVIDER_SITE_OTHER): Payer: Self-pay | Admitting: *Deleted

## 2023-11-25 ENCOUNTER — Encounter: Payer: Self-pay | Admitting: Physician Assistant

## 2023-11-25 ENCOUNTER — Ambulatory Visit: Admitting: Physician Assistant

## 2023-11-25 VITALS — BP 124/82 | HR 75 | Temp 98.1°F | Ht 65.0 in | Wt 135.2 lb

## 2023-11-25 DIAGNOSIS — Z7689 Persons encountering health services in other specified circumstances: Secondary | ICD-10-CM

## 2023-11-25 DIAGNOSIS — E782 Mixed hyperlipidemia: Secondary | ICD-10-CM

## 2023-11-25 DIAGNOSIS — F418 Other specified anxiety disorders: Secondary | ICD-10-CM

## 2023-11-25 MED ORDER — ATORVASTATIN CALCIUM 20 MG PO TABS: 20.0000 mg | ORAL_TABLET | Freq: Every day | ORAL | 1 refills | Status: AC

## 2023-11-25 MED ORDER — BUPROPION HCL ER (XL) 300 MG PO TB24: 300.0000 mg | ORAL_TABLET | Freq: Every day | ORAL | 1 refills | Status: DC

## 2023-11-25 NOTE — Progress Notes (Signed)
 New Patient Office Visit  Subjective    Patient ID: Candice Nelson, female    DOB: 1956/02/20  Age: 68 y.o. MRN: [id number]  CC: No chief complaint on file.   HPI Candice Nelson presents to establish care  Patient presents today with past medical history significant for hyperlipidemia and depression with anxiety. She reports daily compliance with medications. She reports being in great health. She is up to date on all preventative health. She does not have concerns or complaints todat aside from establishing care.   Outpatient Encounter Medications as of 11/25/2023  Medication Sig   [DISCONTINUED] atorvastatin  (LIPITOR) 20 MG tablet Take 20 mg by mouth daily.   acetaminophen (TYLENOL) 500 MG tablet Take 500-1,000 mg by mouth 2 (two) times daily as needed for moderate pain or headache.   atorvastatin  (LIPITOR) 20 MG tablet Take 1 tablet (20 mg total) by mouth daily.   buPROPion  (WELLBUTRIN  XL) 300 MG 24 hr tablet Take 1 tablet (300 mg total) by mouth daily.   diphenhydramine-acetaminophen (TYLENOL PM) 25-500 MG TABS tablet Take 0.5-1 tablets by mouth at bedtime as needed (sleep).   Soft Lens Products (BIOTRUE) SOLN Place 1 drop into both eyes 2 (two) times daily as needed (dry eyes).   [DISCONTINUED] buPROPion  (WELLBUTRIN  XL) 300 MG 24 hr tablet Take 300 mg by mouth daily.   No facility-administered encounter medications on file as of 11/25/2023.    Past Medical History:  Diagnosis Date   Breast cancer [facility]) 2013   right breast   Depression    Personal history of malignant neoplasm of breast    Personal history of radiation therapy     Past Surgical History:  Procedure Laterality Date   ABDOMINAL HYSTERECTOMY  2007   BREAST EXCISIONAL BIOPSY Right 10/2011   breast ca radation   BREAST LUMPECTOMY Right 2013   BREAST SURGERY Right 2013   T1c N0, M0, ER positive, PR indeterminate, no HER-2/neu amplification   COLON SURGERY  2006   perforated bowel    COLONOSCOPY      COLONOSCOPY N/A 12/13/2018   Procedure: COLONOSCOPY;  Surgeon: Harvey Margo CROME, MD;  [facility];  Service: Endoscopy;  Laterality: N/A;  12:00   HEEL SPUR SURGERY Left 2010   POLYPECTOMY  12/13/2018   Procedure: POLYPECTOMY;  Surgeon: Harvey Margo CROME, MD;  [facility];  Service: Endoscopy;;   uterine ablation   2005    Family History  Problem Relation Age of Onset   Breast cancer Paternal Aunt     Social History   Socioeconomic History   Marital status: Divorced    Spouse name: Not on file   Number of children: Not on file   Years of education: Not on file   Highest education level: Associate degree: academic program  Occupational History   Not on file  Tobacco Use   Smoking status: Former    Current packs/day: 1.00    Average packs/day: 1 pack/day for 2.0 years (2.0 ttl pk-yrs)    Types: Cigarettes   Smokeless tobacco: Never  Substance and Sexual Activity   Alcohol use: No   Drug use: No   Sexual activity: Not on file  Other Topics Concern   Not on file  Social History Narrative   Not on file   Social Drivers of Health   Financial Resource Strain: Low Risk  (11/22/2023)   Overall Financial Resource Strain (CARDIA)    Difficulty of Paying Living Expenses: Not hard  at all  Food Insecurity: No Food Insecurity (11/22/2023)   Hunger Vital Sign    Worried About Running Out of Food in the Last Year: Never true    Ran Out of Food in the Last Year: Never true  Transportation Needs: No Transportation Needs (11/22/2023)   PRAPARE - Administrator, Civil Service (Medical): No    Lack of Transportation (Non-Medical): No  Physical Activity: Sufficiently Active (11/22/2023)   Exercise Vital Sign    Days of Exercise per Week: 7 days    Minutes of Exercise per Session: 120 min  Stress: No Stress Concern Present (11/22/2023)   [employer] of Occupational Health - Occupational Stress Questionnaire    Feeling of Stress: Not at all  Social  Connections: Socially Isolated (11/22/2023)   Social Connection and Isolation Panel    Frequency of Communication with Friends and Family: Three times a week    Frequency of Social Gatherings with Friends and Family: Twice a week    Attends Religious Services: Never    Database administrator or Organizations: No    Attends Engineer, structural: Not on file    Marital Status: Divorced  Intimate Partner Violence: Not on file    Review of Systems  Constitutional:  Negative for chills, fever and malaise/fatigue.  Eyes:  Negative for blurred vision and double vision.  Respiratory:  Negative for cough and shortness of breath.   Cardiovascular:  Negative for chest pain and palpitations.  Musculoskeletal:  Negative for joint pain and myalgias.  Neurological:  Negative for dizziness and headaches.  Psychiatric/Behavioral:  Negative for depression. The patient is not nervous/anxious.         Objective    BP 124/82   Pulse 75   Temp 98.1 F (36.7 C)   Ht 5' 5 (1.651 m)   Wt 135 lb 3.2 oz (61.3 kg)   SpO2 95%   BMI 22.50 kg/m   Physical Exam Constitutional:      Appearance: Normal appearance. She is normal weight.  HENT:     Head: Normocephalic and atraumatic.     Mouth/Throat:     Mouth: Mucous membranes are moist.     Pharynx: Oropharynx is clear.  Eyes:     Extraocular Movements: Extraocular movements intact.     Conjunctiva/sclera: Conjunctivae normal.  Cardiovascular:     Rate and Rhythm: Normal rate and regular rhythm.     Heart sounds: Normal heart sounds. No murmur heard. Pulmonary:     Effort: Pulmonary effort is normal.     Breath sounds: Normal breath sounds. No wheezing or rales.  Musculoskeletal:     Right lower leg: No edema.     Left lower leg: No edema.  Skin:    General: Skin is warm and dry.  Neurological:     General: No focal deficit present.     Mental Status: She is alert and oriented to person, place, and time.  Psychiatric:         Mood and Affect: Mood normal.        Behavior: Behavior normal.        Assessment & Plan:  Encounter to establish care  Mixed hyperlipidemia Assessment & Plan: Stable. Continue with current management without changes. Medications refilled today.  Discussed healthy diet and lifestyle. Lipid panel today.   Orders: -     Lipid panel -     CMP14+EGFR -     CBC with Differential/Platelet -  Atorvastatin  Calcium ; Take 1 tablet (20 mg total) by mouth daily.  Dispense: 90 tablet; Refill: 1  Depression with anxiety Assessment & Plan: Stable. Continue current treatment plan. Medications refilled today.   Orders: -     buPROPion  HCl ER (XL); Take 1 tablet (300 mg total) by mouth daily.  Dispense: 90 tablet; Refill: 1    Return in about 6 months (around 05/27/2024) for phys .   Charmaine Zulay Corrie, PA-C

## 2023-11-25 NOTE — Assessment & Plan Note (Signed)
 Stable. Continue current treatment plan. Medications refilled today.

## 2023-11-25 NOTE — Assessment & Plan Note (Signed)
 Stable. Continue with current management without changes. Medications refilled today. Discussed healthy diet and lifestyle. Lipid panel today.

## 2023-11-26 ENCOUNTER — Ambulatory Visit: Payer: Self-pay | Admitting: Physician Assistant

## 2023-11-26 LAB — CBC WITH DIFFERENTIAL/PLATELET
Basophils Absolute: 0 x10E3/uL (ref 0.0–0.2)
Basos: 0 %
EOS (ABSOLUTE): 0.2 x10E3/uL (ref 0.0–0.4)
Eos: 3 %
Hematocrit: 46.4 % (ref 34.0–46.6)
Hemoglobin: 14.9 g/dL (ref 11.1–15.9)
Immature Grans (Abs): 0 x10E3/uL (ref 0.0–0.1)
Immature Granulocytes: 0 %
Lymphocytes Absolute: 1.8 x10E3/uL (ref 0.7–3.1)
Lymphs: 25 %
MCH: 32.6 pg (ref 26.6–33.0)
MCHC: 32.1 g/dL (ref 31.5–35.7)
MCV: 102 fL — ABNORMAL HIGH (ref 79–97)
Monocytes Absolute: 0.7 x10E3/uL (ref 0.1–0.9)
Monocytes: 9 %
Neutrophils Absolute: 4.5 x10E3/uL (ref 1.4–7.0)
Neutrophils: 63 %
Platelets: 195 x10E3/uL (ref 150–450)
RBC: 4.57 x10E6/uL (ref 3.77–5.28)
RDW: 13.4 % (ref 11.7–15.4)
WBC: 7.1 x10E3/uL (ref 3.4–10.8)

## 2023-11-26 LAB — CMP14+EGFR
ALT: 30 IU/L (ref 0–32)
AST: 22 IU/L (ref 0–40)
Albumin: 4.4 g/dL (ref 3.9–4.9)
Alkaline Phosphatase: 128 IU/L — ABNORMAL HIGH (ref 44–121)
BUN/Creatinine Ratio: 26 (ref 12–28)
BUN: 19 mg/dL (ref 8–27)
Bilirubin Total: 0.5 mg/dL (ref 0.0–1.2)
CO2: 23 mmol/L (ref 20–29)
Calcium: 9.6 mg/dL (ref 8.7–10.3)
Chloride: 101 mmol/L (ref 96–106)
Creatinine, Ser: 0.74 mg/dL (ref 0.57–1.00)
Globulin, Total: 2.4 g/dL (ref 1.5–4.5)
Glucose: 103 mg/dL — ABNORMAL HIGH (ref 70–99)
Potassium: 4.7 mmol/L (ref 3.5–5.2)
Sodium: 140 mmol/L (ref 134–144)
Total Protein: 6.8 g/dL (ref 6.0–8.5)
eGFR: 89 mL/min/1.73 (ref >59)

## 2023-11-26 LAB — LIPID PANEL
Chol/HDL Ratio: 3.5 ratio (ref 0.0–4.4)
Cholesterol, Total: 138 mg/dL (ref 100–199)
HDL: 40 mg/dL (ref >39)
LDL Chol Calc (NIH): 57 mg/dL (ref 0–99)
Triglycerides: 258 mg/dL — ABNORMAL HIGH (ref 0–149)
VLDL Cholesterol Cal: 41 mg/dL — ABNORMAL HIGH (ref 5–40)

## 2023-12-01 DIAGNOSIS — M542 Cervicalgia: Secondary | ICD-10-CM | POA: Diagnosis not present

## 2024-01-03 ENCOUNTER — Emergency Department (HOSPITAL_COMMUNITY)

## 2024-01-03 ENCOUNTER — Other Ambulatory Visit: Payer: Self-pay

## 2024-01-03 ENCOUNTER — Encounter (HOSPITAL_COMMUNITY): Payer: Self-pay

## 2024-01-03 ENCOUNTER — Emergency Department (HOSPITAL_COMMUNITY)
Admission: EM | Admit: 2024-01-03 | Discharge: 2024-01-03 | Disposition: A | Discharge status: Home / Self Care | Attending: Emergency Medicine | Admitting: Emergency Medicine

## 2024-01-03 DIAGNOSIS — S0990XA Unspecified injury of head, initial encounter: Secondary | ICD-10-CM | POA: Diagnosis not present

## 2024-01-03 DIAGNOSIS — R519 Headache, unspecified: Secondary | ICD-10-CM | POA: Diagnosis not present

## 2024-01-03 DIAGNOSIS — M542 Cervicalgia: Secondary | ICD-10-CM | POA: Insufficient documentation

## 2024-01-03 DIAGNOSIS — S0003XA Contusion of scalp, initial encounter: Secondary | ICD-10-CM | POA: Diagnosis not present

## 2024-01-03 DIAGNOSIS — R102 Pelvic and perineal pain: Secondary | ICD-10-CM | POA: Insufficient documentation

## 2024-01-03 DIAGNOSIS — Z79899 Other long term (current) drug therapy: Secondary | ICD-10-CM | POA: Insufficient documentation

## 2024-01-03 DIAGNOSIS — Z7982 Long term (current) use of aspirin: Secondary | ICD-10-CM | POA: Insufficient documentation

## 2024-01-03 DIAGNOSIS — S20212A Contusion of left front wall of thorax, initial encounter: Secondary | ICD-10-CM | POA: Diagnosis not present

## 2024-01-03 DIAGNOSIS — R0789 Other chest pain: Secondary | ICD-10-CM | POA: Insufficient documentation

## 2024-01-03 DIAGNOSIS — W19XXXA Unspecified fall, initial encounter: Secondary | ICD-10-CM

## 2024-01-03 NOTE — ED Triage Notes (Signed)
 Pt was walking 3 dogs this am and had a scuffle with 2 and fell onto concrete. Hit back of head and bottom. C/o headache mostly. Takes ASA daily.

## 2024-01-03 NOTE — ED Provider Notes (Signed)
 Candice Nelson   CSN: [id number] Arrival date & time: 01/03/24  9051     Patient presents with: Candice Nelson is a 68 y.o. female.   Patient is a 68 year old female who presents to the emergency department with a chief complaint of pain to the right side of her head, left side of ribs and posterior pelvis.  Patient notes that she was walking her dogs this morning when they were involved in a scuffle with some other dogs causing her to fall to the ground.  She does take aspirin.  She denies any other anticoagulation use.  Patient denies any pain to her thoracic or lumbar spine.  She denies any associated abdominal pain at this time.  She denies any pain to her bilateral upper or lower extremities.  There was no associated loss of consciousness.  She has no active dizziness, lightheadedness.  She denies any numbness, paresthesias or weakness.   Fall Associated symptoms include headaches.       Prior to Admission medications   Medication Sig Start Date End Date Taking? Authorizing Provider  acetaminophen (TYLENOL) 500 MG tablet Take 500-1,000 mg by mouth 2 (two) times daily as needed for moderate pain or headache.    [provider]  atorvastatin  (LIPITOR) 20 MG tablet Take 1 tablet (20 mg total) by mouth daily. 11/25/23   Grooms, Courtney, PA-C  buPROPion  (WELLBUTRIN  XL) 300 MG 24 hr tablet Take 1 tablet (300 mg total) by mouth daily. 11/25/23   Grooms, Courtney, PA-C  diphenhydramine-acetaminophen (TYLENOL PM) 25-500 MG TABS tablet Take 0.5-1 tablets by mouth at bedtime as needed (sleep).    [provider]  Soft Lens Products (BIOTRUE) SOLN Place 1 drop into both eyes 2 (two) times daily as needed (dry eyes).    [provider]    Allergies: Demerol [meperidine], Morphine and codeine, Other, Oysters [shellfish allergy], Percocet [oxycodone-acetaminophen], Valium [diazepam], Penicillins, and  Sulfa antibiotics    Review of Systems  Respiratory:         Left-sided rib pain  Neurological:  Positive for headaches.  All other systems reviewed and are negative.   Updated Vital Signs Ht 5' 5 (1.651 m)   Wt 61.5 kg   BMI 22.56 kg/m   Physical Exam Vitals and nursing Nelson reviewed.  Constitutional:      General: She is not in acute distress.    Appearance: Normal appearance. She is not ill-appearing.  HENT:     Head: Normocephalic and atraumatic.     Nose: Nose normal.     Mouth/Throat:     Mouth: Mucous membranes are moist.  Eyes:     Extraocular Movements: Extraocular movements intact.     Conjunctiva/sclera: Conjunctivae normal.     Pupils: Pupils are equal, round, and reactive to light.  Neck:     Comments: Mild midline tenderness, no step-off or deformity Cardiovascular:     Rate and Rhythm: Normal rate and regular rhythm.     Pulses: Normal pulses.     Heart sounds: Normal heart sounds. No murmur heard.    No gallop.  Pulmonary:     Effort: Pulmonary effort is normal. No respiratory distress.     Breath sounds: Normal breath sounds. No stridor. No wheezing, rhonchi or rales.     Comments: Tender to palpation over the left anterior and lateral chest wall Abdominal:     General: Abdomen is flat. Bowel sounds are normal.  There is no distension.     Palpations: Abdomen is soft.     Tenderness: There is no abdominal tenderness. There is no guarding.     Comments: No overlying bruising  Musculoskeletal:        General: Normal range of motion.     Cervical back: Normal range of motion and neck supple. No rigidity.     Comments: Nontender palpation over bilateral upper and lower extremities, full range of motion noted throughout, pelvis stable to AP and lateral compression, peripheral pulses 2+ in bilateral upper and lower extremities, sensation intact distally, gait stable without assistance, no obvious deformity or bruising, no skin breakdown or ulceration, no  lacerations or abrasions, nontender palpation over thoracic or lumbar spine, no step-off or deformity, no CVA tenderness  Skin:    General: Skin is warm and dry.     Findings: No bruising or rash.  Neurological:     General: No focal deficit present.     Mental Status: She is alert and oriented to person, place, and time. Mental status is at baseline.     Cranial Nerves: No cranial nerve deficit.     Sensory: No sensory deficit.     Motor: No weakness.     Coordination: Coordination normal.     Gait: Gait normal.  Psychiatric:        Mood and Affect: Mood normal.        Behavior: Behavior normal.        Thought Content: Thought content normal.        Judgment: Judgment normal.     (all labs ordered are listed, but only abnormal results are displayed) Labs Reviewed - No data to display  EKG: None  Radiology: No results found.   Procedures   Medications Ordered in the ED - No data to display                                  Medical Decision Making Amount and/or Complexity of Data Reviewed Radiology: ordered.   This patient presents to the ED for concern of fall, headache, left-sided rib pain differential diagnosis includes contusion, intracranial hemorrhage, concussion, rib fracture, long bone or joint fracture    Additional history obtained:  Additional history obtained from family External records from outside source obtained and reviewed including none    Imaging Studies ordered:  I ordered imaging studies including CT scan head, CT cervical spine, x-ray left ribs and pelvis I independently visualized and interpreted imaging which showed no acute intracranial hemorrhage, no cervical spine fracture, no rib fractures, no pelvic fracture I agree with the radiologist interpretation    Problem List / ED Course:  Patient is doing well at this time and is stable for discharge home.  Discussed with patient that all imaging in the emergency department has been  unremarkable.  She has no indication for intracranial hemorrhage, cervical spine fracture.  She was nontender palpation of her thoracic and lumbar spine.  She was nontender to palpation of her abdomen and had no distention or bruising.  Low suspicion for intra-abdominal bleeding at this point.  Patient has no concerning neurological deficits and do not suspect any further advanced imaging is warranted.  Patient had no tenderness over bilateral upper and lower extremities.  Gait is stable without assistance.  Close follow-up with PCP was discussed as well as strict turn precautions for any new or worsening symptoms.  Patient voiced understanding and had no additional questions.   Social Determinants of Health:  None        Final diagnoses:  None    ED Discharge Orders     None          Candice Nelson 01/03/24 1112    Dean Clarity, MD 01/03/24 1531

## 2024-01-03 NOTE — Discharge Instructions (Signed)
 Please follow-up closely with your primary care doctor on an outpatient basis.  Return to emergency department immediately for any new or worsening symptoms.

## 2024-01-05 DIAGNOSIS — Z008 Encounter for other general examination: Secondary | ICD-10-CM | POA: Diagnosis not present

## 2024-01-05 DIAGNOSIS — E785 Hyperlipidemia, unspecified: Secondary | ICD-10-CM | POA: Diagnosis not present

## 2024-01-05 DIAGNOSIS — F3341 Major depressive disorder, recurrent, in partial remission: Secondary | ICD-10-CM | POA: Diagnosis not present

## 2024-01-05 DIAGNOSIS — R7303 Prediabetes: Secondary | ICD-10-CM | POA: Diagnosis not present

## 2024-01-15 ENCOUNTER — Encounter: Payer: Self-pay | Admitting: Physician Assistant

## 2024-01-18 ENCOUNTER — Encounter (HOSPITAL_COMMUNITY): Payer: Self-pay | Admitting: Emergency Medicine

## 2024-01-18 ENCOUNTER — Emergency Department (HOSPITAL_COMMUNITY)
Admission: EM | Admit: 2024-01-18 | Discharge: 2024-01-18 | Disposition: A | Discharge status: Home / Self Care | Attending: Emergency Medicine | Admitting: Emergency Medicine

## 2024-01-18 ENCOUNTER — Emergency Department (HOSPITAL_COMMUNITY)

## 2024-01-18 ENCOUNTER — Other Ambulatory Visit: Payer: Self-pay

## 2024-01-18 DIAGNOSIS — R0789 Other chest pain: Secondary | ICD-10-CM

## 2024-01-18 DIAGNOSIS — J209 Acute bronchitis, unspecified: Secondary | ICD-10-CM | POA: Insufficient documentation

## 2024-01-18 MED ORDER — BENZONATATE 100 MG PO CAPS: 100.0000 mg | ORAL_CAPSULE | Freq: Three times a day (TID) | ORAL | 0 refills | Status: DC

## 2024-01-18 MED ORDER — HYDROCODONE-ACETAMINOPHEN 5-325 MG PO TABS: 1.0000 | ORAL_TABLET | Freq: Four times a day (QID) | ORAL | 0 refills | Status: DC | PRN

## 2024-01-18 MED ORDER — DOXYCYCLINE HYCLATE 100 MG PO CAPS: 100.0000 mg | ORAL_CAPSULE | Freq: Two times a day (BID) | ORAL | 0 refills | Status: DC

## 2024-01-18 NOTE — ED Triage Notes (Signed)
 Pt reports that she was coughing last night and felt a pop in her back.  She is now experiencing pain on the left side of her back.  Pt has trouble moving, changing position and being still.  She has had the cough for over a month but really bad for the last 5 days.

## 2024-01-18 NOTE — Discharge Instructions (Signed)
 Begin taking doxycycline as prescribed.  Take ibuprofen 600 mg every 6 hours as needed for pain.  Begin taking hydrocodone as needed for pain not relieved with ibuprofen.  Take Tessalon as prescribed as needed for cough.  Follow-up with primary doctor if symptoms are not improving in the next week.

## 2024-01-18 NOTE — ED Provider Notes (Signed)
 [place] EMERGENCY DEPARTMENT AT [facility] Provider Note   CSN: [id number] Arrival date & time: 01/18/24  9492     Patient presents with: Back Pain   Candice Nelson is a 68 y.o. female.   Patient is a 68 year old female with history of hyperlipidemia, depression.  Patient presenting today with complaints of left-sided rib pain.  She has been sick for the past week, then yesterday evening began coughing.  She coughed so hard that she felt a pop in her left chest along with pain.  She denies any shortness of breath.  She has been having some productive cough, but denies any fever.       Prior to Admission medications   Medication Sig Start Date End Date Taking? Authorizing Provider  acetaminophen (TYLENOL) 500 MG tablet Take 500-1,000 mg by mouth 2 (two) times daily as needed for moderate pain or headache.    [provider]  atorvastatin  (LIPITOR) 20 MG tablet Take 1 tablet (20 mg total) by mouth daily. 11/25/23   Grooms, Courtney, PA-C  buPROPion  (WELLBUTRIN  XL) 300 MG 24 hr tablet Take 1 tablet (300 mg total) by mouth daily. 11/25/23   Grooms, Courtney, PA-C  diphenhydramine-acetaminophen (TYLENOL PM) 25-500 MG TABS tablet Take 0.5-1 tablets by mouth at bedtime as needed (sleep).    [provider]  Soft Lens Products (BIOTRUE) SOLN Place 1 drop into both eyes 2 (two) times daily as needed (dry eyes).    [provider]    Allergies: Demerol [meperidine], Morphine and codeine, Other, Oysters [shellfish allergy], Percocet [oxycodone-acetaminophen], Valium [diazepam], Penicillins, and Sulfa antibiotics    Review of Systems  All other systems reviewed and are negative.   Updated Vital Signs BP (!) 159/99   Pulse 80   Temp 98.3 F (36.8 C) (Oral)   Resp 20   Ht 5' 6 (1.676 m)   Wt 59.9 kg   SpO2 92%   BMI 21.31 kg/m   Physical Exam Vitals and nursing note reviewed.  Constitutional:      General: She is not in acute distress.     Appearance: She is well-developed. She is not diaphoretic.  HENT:     Head: Normocephalic and atraumatic.  Cardiovascular:     Rate and Rhythm: Normal rate and regular rhythm.     Heart sounds: No murmur heard.    No friction rub. No gallop.  Pulmonary:     Effort: Pulmonary effort is normal. No respiratory distress.     Breath sounds: Normal breath sounds. No wheezing.     Comments: There is tenderness to palpation in the left lateral ribs.  There is no palpable abnormality or crepitus.  Breath sounds are equal. Abdominal:     General: Bowel sounds are normal. There is no distension.     Palpations: Abdomen is soft.     Tenderness: There is no abdominal tenderness.  Musculoskeletal:        General: Normal range of motion.     Cervical back: Normal range of motion and neck supple.  Skin:    General: Skin is warm and dry.  Neurological:     General: No focal deficit present.     Mental Status: She is alert and oriented to person, place, and time.     (all labs ordered are listed, but only abnormal results are displayed) Labs Reviewed - No data to display  EKG: None  Radiology: No results found.   Procedures   Medications Ordered in  the ED - No data to display                                  Medical Decision Making Amount and/or Complexity of Data Reviewed Radiology: ordered.  Risk Prescription drug management.   Patient presenting with URI symptoms for the past week.  She reports coughing so forcefully yesterday that she felt a pop in her left rib cage followed by pain.  She arrives here with stable vital signs and is afebrile.  There is no hypoxia and breath sounds are equal.  Chest x-ray obtained with x-rays of the left ribs showing no acute cardiopulmonary abnormality.  I suspect the possibility of an occult rib fracture, so we will treat with pain medication and cough medication.  She is also describing URI symptoms for greater than 1 week, so will  prescribe antibiotics as well.  To follow-up with primary doctor if not improving.     Final diagnoses:  None    ED Discharge Orders     None          Geroldine Berg, MD 01/18/24 [phone]

## 2024-03-30 ENCOUNTER — Other Ambulatory Visit: Payer: Self-pay | Admitting: Physician Assistant

## 2024-03-30 DIAGNOSIS — F418 Other specified anxiety disorders: Secondary | ICD-10-CM

## 2024-05-12 ENCOUNTER — Ambulatory Visit

## 2024-05-19 ENCOUNTER — Ambulatory Visit

## 2024-05-19 VITALS — Ht 66.0 in | Wt 138.0 lb

## 2024-05-19 DIAGNOSIS — Z780 Asymptomatic menopausal state: Secondary | ICD-10-CM

## 2024-05-19 DIAGNOSIS — Z1231 Encounter for screening mammogram for malignant neoplasm of breast: Secondary | ICD-10-CM

## 2024-05-19 DIAGNOSIS — Z0000 Encounter for general adult medical examination without abnormal findings: Secondary | ICD-10-CM

## 2024-05-19 DIAGNOSIS — Z1211 Encounter for screening for malignant neoplasm of colon: Secondary | ICD-10-CM

## 2024-05-19 NOTE — Progress Notes (Signed)
 "  HM Addressed: Mammogram ordered DEXA ordered Referral sent to GI for colonoscopy Chief Complaint  Patient presents with   Medicare Wellness     Subjective:   CHASADY LONGWELL is a 69 y.o. female who presents for a Medicare Annual Wellness Visit.  Visit info / Clinical Intake: Medicare Wellness Visit Type:: Subsequent Annual Wellness Visit Persons participating in visit and providing information:: patient Medicare Wellness Visit Mode:: Video Since this visit was completed virtually, some vitals may be partially provided or unavailable. Missing vitals are due to the limitations of the virtual format.: Documented vitals are patient reported If Telephone or Video please confirm:: I connected with patient using audio/video enable telemedicine. I verified patient identity with two identifiers, discussed telehealth limitations, and patient agreed to proceed. Patient Location:: home Provider Location:: home office Interpreter Needed?: No Pre-visit prep was completed: yes AWV questionnaire completed by patient prior to visit?: yes Date:: 05/15/24 Living arrangements:: (!) (Patient-Rptd) lives alone Patient's Overall Health Status Rating: (Patient-Rptd) excellent Typical amount of pain: (Patient-Rptd) some Does pain affect daily life?: (Patient-Rptd) no Are you currently prescribed opioids?: (!) yes  Dietary Habits and Nutritional Risks How many meals a day?: (Patient-Rptd) 2 Eats fruit and vegetables daily?: (Patient-Rptd) yes Most meals are obtained by: (Patient-Rptd) preparing own meals; eating out In the last 2 weeks, have you had any of the following?: none Diabetic:: no  Functional Status Activities of Daily Living (to include ambulation/medication): (Patient-Rptd) Independent Ambulation: (Patient-Rptd) Independent Medication Administration: (Patient-Rptd) Independent Home Management (perform basic housework or laundry): (Patient-Rptd) Independent Manage your own finances?:  (Patient-Rptd) yes Primary transportation is: (Patient-Rptd) driving Concerns about vision?: (Patient-Rptd) no *vision screening is required for WTM* Concerns about hearing?: (Patient-Rptd) no  Fall Screening Falls in the past year?: (Patient-Rptd) 0 Number of falls in past year: 0 Was there an injury with Fall?: 0 Fall Risk Category Calculator: 0 Patient Fall Risk Level: Low Fall Risk  Fall Risk Patient at Risk for Falls Due to: Orthopedic patient Fall risk Follow up: Falls evaluation completed; Education provided; Falls prevention discussed  Home and Transportation Safety: All rugs have non-skid backing?: (Patient-Rptd) yes All stairs or steps have railings?: (Patient-Rptd) yes Grab bars in the bathtub or shower?: (Patient-Rptd) yes Have non-skid surface in bathtub or shower?: (Patient-Rptd) yes Good home lighting?: (Patient-Rptd) yes Regular seat belt use?: (Patient-Rptd) yes Hospital stays in the last year:: (Patient-Rptd) no  Cognitive Assessment Difficulty concentrating, remembering, or making decisions? : (Patient-Rptd) no Will 6CIT or Mini Cog be Completed: yes What year is it?: 0 points What month is it?: 0 points Give patient an address phrase to remember (5 components): [address] [place] About what time is it?: 0 points Count backwards from 20 to 1: 0 points Say the months of the year in reverse: 0 points Repeat the address phrase from earlier: 0 points 6 CIT Score: 0 points  Advance Directives (For Healthcare) Does Patient Have a Medical Advance Directive?: No Would patient like information on creating a medical advance directive?: No - Patient declined  Reviewed/Updated  Reviewed/Updated: Reviewed All (Medical, Surgical, Family, Medications, Allergies, Care Teams, Patient Goals)    Allergies (verified) Demerol [meperidine], Morphine and codeine, Other, Oysters [shellfish allergy], Percocet [oxycodone-acetaminophen ], Valium [diazepam], Penicillins,  and Sulfa antibiotics   Current Medications (verified) Outpatient Encounter Medications as of 05/19/2024  Medication Sig   acetaminophen  (TYLENOL ) 500 MG tablet Take 500-1,000 mg by mouth 2 (two) times daily as needed for moderate pain or headache.   atorvastatin  (  LIPITOR) 20 MG tablet Take 1 tablet (20 mg total) by mouth daily.   buPROPion  (WELLBUTRIN  XL) 300 MG 24 hr tablet Take 1 tablet by mouth once daily   diphenhydramine-acetaminophen  (TYLENOL  PM) 25-500 MG TABS tablet Take 0.5-1 tablets by mouth at bedtime as needed (sleep).   Soft Lens Products (BIOTRUE) SOLN Place 1 drop into both eyes 2 (two) times daily as needed (dry eyes).   HYDROcodone -acetaminophen  (NORCO/VICODIN) 5-325 MG tablet Take 1-2 tablets by mouth every 6 (six) hours as needed. (Patient not taking: Reported on 05/19/2024)   [DISCONTINUED] benzonatate  (TESSALON ) 100 MG capsule Take 1 capsule (100 mg total) by mouth every 8 (eight) hours. (Patient not taking: Reported on 05/19/2024)   [DISCONTINUED] doxycycline  (VIBRAMYCIN ) 100 MG capsule Take 1 capsule (100 mg total) by mouth 2 (two) times daily. One po bid x 7 days (Patient not taking: Reported on 05/19/2024)   No facility-administered encounter medications on file as of 05/19/2024.    History: Past Medical History:  Diagnosis Date   Allergy    Anemia    Arthritis    Breast cancer (HCC) 2013   right breast   Cataract 2023   Not bad enough to operate   Depression    Personal history of malignant neoplasm of breast    Personal history of radiation therapy    Past Surgical History:  Procedure Laterality Date   ABDOMINAL HYSTERECTOMY  2007   BREAST EXCISIONAL BIOPSY Right 10/2011   breast ca radation   BREAST LUMPECTOMY Right 2013   BREAST SURGERY Right 2013   T1c N0, M0, ER positive, PR indeterminate, no HER-2/neu amplification   COLON SURGERY  2006   perforated bowel    COLONOSCOPY     COLONOSCOPY N/A 12/13/2018   Procedure: COLONOSCOPY;  Surgeon: Harvey Margo CROME, MD;  [facility]: [facility];  Service: Endoscopy;  Laterality: N/A;  12:00   FRACTURE SURGERY  1969   Broken left arm   HEEL SPUR SURGERY Left 2010   HERNIA REPAIR  1960   JOINT REPLACEMENT  2025   Knee replacement   POLYPECTOMY  12/13/2018   Procedure: POLYPECTOMY;  Surgeon: Harvey Margo CROME, MD;  Location: [facility];  Service: Endoscopy;;   TUBAL LIGATION  1988   After daughter born   uterine ablation   2005   Family History  Problem Relation Age of Onset   Breast cancer Paternal Aunt    Hearing loss Mother    Varicose Veins Mother    Cancer Father    Stroke Father    Heart disease Father    Diabetes Maternal Grandmother    Miscarriages / Stillbirths Paternal Grandmother    Cancer Paternal Aunt    Social History   Occupational History   Not on file  Tobacco Use   Smoking status: Former    Current packs/day: 0.00    Average packs/day: 1 pack/day for 2.0 years (2.0 ttl pk-yrs)    Types: Cigarettes    Quit date: 04/15/1999    Years since quitting: 25.1   Smokeless tobacco: Never   Tobacco comments:    Quit working n 1970's and started back 2001...then quit  Substance and Sexual Activity   Alcohol use: Not Currently    Comment: No...a beer or glass of wine once a month   Drug use: No   Sexual activity: Not Currently    Birth control/protection: Surgical    Comment: Hysterectomy   Tobacco Counseling Counseling given: Yes Tobacco comments: Quit working  n 1970's and started back 2001...then quit  SDOH Screenings   Food Insecurity: Patient Declined (05/15/2024)  Housing: Low Risk (05/15/2024)  Transportation Needs: No Transportation Needs (05/15/2024)  Utilities: Not At Risk (05/19/2024)  Alcohol Screen: Low Risk (05/15/2024)  Depression (PHQ2-9): Low Risk (05/19/2024)  Financial Resource Strain: Low Risk (05/15/2024)  Physical Activity: Insufficiently Active (05/15/2024)  Social Connections: Unknown (05/15/2024)  Stress: No Stress Concern Present (05/15/2024)  Tobacco Use:  Medium Risk (05/19/2024)  Health Literacy: Adequate Health Literacy (05/19/2024)   See flowsheets for full screening details  Depression Screen PHQ 2 & 9 Depression Scale- Over the past 2 weeks, how often have you been bothered by any of the following problems? Little interest or pleasure in doing things: 0 Feeling down, depressed, or hopeless (PHQ Adolescent also includes...irritable): 0 PHQ-2 Total Score: 0 Trouble falling or staying asleep, or sleeping too much: 0 Feeling tired or having little energy: 0 Poor appetite or overeating (PHQ Adolescent also includes...weight loss): 0 Feeling bad about yourself - or that you are a failure or have let yourself or your family down: 0 Trouble concentrating on things, such as reading the newspaper or watching television (PHQ Adolescent also includes...like school work): 0 Moving or speaking so slowly that other people could have noticed. Or the opposite - being so fidgety or restless that you have been moving around a lot more than usual: 0 Thoughts that you would be better off dead, or of hurting yourself in some way: 0 PHQ-9 Total Score: 0  Depression Treatment Depression Interventions/Treatment : EYV7-0 Score <4 Follow-up Not Indicated     Goals Addressed               This Visit's Progress     stay healthy (pt-stated)            Objective:    Today's Vitals   05/19/24 1428  Weight: 138 lb (62.6 kg)  Height: 5' 6 (1.676 m)   Body mass index is 22.27 kg/m.  Hearing/Vision screen Hearing Screening - Comments:: Patient denies any hearing difficulties.   Vision Screening - Comments:: Patient is up to date on yearly eye exams with Elsie Bunker   Immunizations and Health Maintenance Health Maintenance  Topic Date Due   Hepatitis C Screening  Never done   Colonoscopy  12/12/2021   COVID-19 Vaccine (3 - Moderna risk series) 03/11/2022   Bone Density Scan  07/05/2022   Mammogram  05/10/2023   Medicare Annual Wellness  (AWV)  05/19/2025   DTaP/Tdap/Td (2 - Td or Tdap) 12/31/2032   Pneumococcal Vaccine: 50+ Years  Completed   Influenza Vaccine  Completed   Zoster Vaccines- Shingrix  Completed   Meningococcal B Vaccine  Aged Out        Assessment/Plan:  This is a routine wellness examination for Shemeca.  Patient Care Team: Grooms, Charmaine, [place] as PCP - General (Physician Assistant) Cindie Jesusa HERO, RN as Registered Nurse Dannielle Arlean FALCON, RN (Inactive) as Registered Nurse Cindie Carlin POUR, DO as Consulting Physician (Gastroenterology) [facility])  I have personally reviewed and noted the following in the patients chart:   Medical and social history Use of alcohol, tobacco or illicit drugs  Current medications and supplements including opioid prescriptions. Functional ability and status Nutritional status Physical activity Advanced directives List of other physicians Hospitalizations, surgeries, and ER visits in previous 12 months Vitals Screenings to include cognitive, depression, and falls Referrals and appointments  Orders Placed This Encounter  Procedures  HM MAMMOGRAPHY    This external order was created through the Results Console.   DG Bone Density    Standing Status:   Future    Expiration Date:   05/19/2025    Reason for Exam (SYMPTOM  OR DIAGNOSIS REQUIRED):   post menopausal estrogen deficient    Preferred imaging location?:   [facility]   MM 3D SCREENING MAMMOGRAM BILATERAL BREAST    Standing Status:   Future    Expiration Date:   05/19/2025    Reason for Exam (SYMPTOM  OR DIAGNOSIS REQUIRED):   breast cancer screening    Preferred imaging location?:   [facility]   Ambulatory referral to Gastroenterology    Referral Priority:   Routine    Referral Type:   Consultation    Referral Reason:   Specialty Services Required    Number of Visits Requested:   1   In addition, I have reviewed and discussed with patient certain preventive  protocols, quality metrics, and best practice recommendations. A written personalized care plan for preventive services as well as general preventive health recommendations were provided to patient.   Oria Klimas, CMA   05/19/2024   Return May 25, 2025 at 8:00 am, for In office Medicare Well Visit w  Wellness Nurse.  After Visit Summary: (MyChart) Due to this being a telephonic visit, the after visit summary with patients personalized plan was offered to patient via MyChart    "

## 2024-05-19 NOTE — Patient Instructions (Signed)
 Ms. Hearty,  Thank you for taking the time for your Medicare Wellness Visit. I appreciate your continued commitment to your health goals. Please review the care plan we discussed, and feel free to reach out if I can assist you further.  Please note that Annual Wellness Visits do not include a physical exam. Some assessments may be limited, especially if the visit was conducted virtually. If needed, we may recommend an in-person follow-up with your provider.  Ongoing Care Seeing your primary care provider every 3 to 6 months helps us  monitor your health and provide consistent, personalized care.   Aim for 30 minutes of exercise or brisk walking, 6-8 glasses of water, and 5 servings of fruits and vegetables each day.  Referrals If a referral was made during today's visit and you haven't received any updates within two weeks, please contact the referred provider directly to check on the status.  Mammogram/Bone Density Screening: Call [facility] @ Phone: [phone]   Colonoscopy Referral: [facility] at  [address] Phone: [phone]   Recommended Screenings:  Health Maintenance  Topic Date Due   Hepatitis C Screening  Never done   Colon Cancer Screening  12/12/2021   COVID-19 Vaccine (3 - Moderna risk series) 03/11/2022   Osteoporosis screening with Bone Density Scan  07/05/2022   Breast Cancer Screening  05/10/2023   Medicare Annual Wellness Visit  05/07/2024   DTaP/Tdap/Td vaccine (2 - Td or Tdap) 12/31/2032   Pneumococcal Vaccine for age over 54  Completed   Flu Shot  Completed   Zoster (Shingles) Vaccine  Completed   Meningitis B Vaccine  Aged Out       05/15/2024    8:04 PM  Advanced Directives  Does Patient Have a Medical Advance Directive? No    Vision: Annual vision screenings are recommended for early detection of glaucoma, cataracts, and diabetic retinopathy. These exams can also reveal signs  of chronic conditions such as diabetes and high blood pressure.  Dental: Annual dental screenings help detect early signs of oral cancer, gum disease, and other conditions linked to overall health, including heart disease and diabetes.  Please see the attached documents for additional preventive care recommendations.

## 2024-05-20 ENCOUNTER — Telehealth: Payer: Self-pay | Admitting: *Deleted

## 2024-05-20 NOTE — Telephone Encounter (Signed)
 Procedure: COLONOSCOPY  Estimated body mass index is 22.27 kg/m as calculated from the following:   Height as of 05/19/24: 5' 6 (1.676 m).   Weight as of 05/19/24: 138 lb (62.6 kg).  Have you had a colonoscopy before?  2020 Dr. Harvey  Do you have family history of colon cancer?  no  Do you have a family history of polyps? no  Previous colonoscopy with polyps removed? YES  Do you have a history colorectal cancer?   no  Are you diabetic?  NO  Do you have a prosthetic or mechanical heart valve? NO  Do you have a pacemaker/defibrillator?   NO  Have you had endocarditis/atrial fibrillation?  NO  Do you use supplemental oxygen/CPAP?  NO  Have you had joint replacement within the last 12 months?  KNEE REPLACEMENT 07/2023  Do you tend to be constipated or have to use laxatives?  NO   Do you have history of alcohol use? If yes, how much and how often.  NO  Do you have history or are you using drugs? If yes, what do are you  using?  NO  Have you ever had a stroke/heart attack?  NO  Have you ever had a heart or other vascular stent placed,?NO  Do you take weight loss medication? NO  female patients,: have you had a hysterectomy?                               are you post menopausal?                                do you still have your menstrual cycle? NO    Date of last menstrual period?   Do you take any blood-thinning medications such as: (Plavix, aspirin, Coumadin, Aggrenox, Brilinta, Xarelto, Eliquis, Pradaxa, Savaysa or Effient)? NO  If yes we need the name, milligram, dosage and who is prescribing doctor:               Current Outpatient Medications on File Prior to Visit  Medication Sig Dispense Refill   atorvastatin  (LIPITOR) 20 MG tablet Take 1 tablet (20 mg total) by mouth daily. 90 tablet 1   buPROPion  (WELLBUTRIN  XL) 300 MG 24 hr tablet Take 1 tablet by mouth once daily 90 tablet 0   ibuprofen (ADVIL) 200 MG tablet Take 200 mg by mouth every 6 (six) hours as  needed.     No current facility-administered medications on file prior to visit.      Allergies  Allergen Reactions   Demerol [Meperidine] Nausea And Vomiting    Severe nausea and vomiting   Morphine And Codeine Hives    Hallucinations    Other Nausea And Vomiting    general anesthesia - nausea and vomiting   Gas used for a previous procedure caused nausea and vomiting   Oysters [Shellfish Allergy] Nausea And Vomiting   Percocet [Oxycodone-Acetaminophen ] Nausea And Vomiting    Severe nausea and vomiting    Valium [Diazepam] Nausea Only   Penicillins Hives and Rash    Did it involve swelling of the face/tongue/throat, SOB, or low BP? No Did it involve sudden or severe rash/hives, skin peeling, or any reaction on the inside of your mouth or nose? Yes Did you need to seek medical attention at a hospital or doctor's office? Unknown When did it last happen?  Childhood allergy  If all above answers are NO, may proceed with cephalosporin use.    Sulfa Antibiotics Rash

## 2024-05-26 ENCOUNTER — Other Ambulatory Visit (HOSPITAL_COMMUNITY)

## 2024-05-26 ENCOUNTER — Ambulatory Visit (HOSPITAL_COMMUNITY)

## 2024-05-27 ENCOUNTER — Encounter: Admitting: Physician Assistant
